# Patient Record
Sex: Male | Born: 1950
Health system: Southern US, Community
[De-identification: ages and names within clinical notes are randomized; demographics above are authoritative.]

## PROBLEM LIST (undated history)

## (undated) DIAGNOSIS — H903 Sensorineural hearing loss, bilateral: Secondary | ICD-10-CM

## (undated) DIAGNOSIS — M1990 Unspecified osteoarthritis, unspecified site: Secondary | ICD-10-CM

## (undated) DIAGNOSIS — K5790 Diverticulosis of intestine, part unspecified, without perforation or abscess without bleeding: Secondary | ICD-10-CM

## (undated) DIAGNOSIS — N200 Calculus of kidney: Secondary | ICD-10-CM

## (undated) DIAGNOSIS — I10 Essential (primary) hypertension: Secondary | ICD-10-CM

## (undated) DIAGNOSIS — N400 Enlarged prostate without lower urinary tract symptoms: Secondary | ICD-10-CM

## (undated) DIAGNOSIS — K219 Gastro-esophageal reflux disease without esophagitis: Secondary | ICD-10-CM

## (undated) HISTORY — DX: Unspecified osteoarthritis, unspecified site: M19.90

## (undated) HISTORY — DX: Sensorineural hearing loss, bilateral: H90.3

## (undated) HISTORY — DX: Calculus of kidney: N20.0

## (undated) HISTORY — PX: OTHER SURGICAL HISTORY: SHX169

## (undated) HISTORY — PX: EYE SURGERY: SHX253

## (undated) HISTORY — DX: Diverticulosis of intestine, part unspecified, without perforation or abscess without bleeding: K57.90

## (undated) HISTORY — DX: Gastro-esophageal reflux disease without esophagitis: K21.9

## (undated) HISTORY — DX: Essential (primary) hypertension: I10

## (undated) HISTORY — PX: CHOLECYSTECTOMY: SHX55

## (undated) HISTORY — DX: Benign prostatic hyperplasia without lower urinary tract symptoms: N40.0

---

## 2002-02-23 ENCOUNTER — Ambulatory Visit (HOSPITAL_COMMUNITY): Admission: RE | Admit: 2002-02-23 | Discharge: 2002-02-23 | Payer: Self-pay | Admitting: Cardiovascular Disease

## 2008-06-20 ENCOUNTER — Ambulatory Visit: Payer: Self-pay | Admitting: Cardiovascular Disease

## 2008-07-23 ENCOUNTER — Ambulatory Visit: Payer: Self-pay | Admitting: Cardiovascular Disease

## 2009-04-12 ENCOUNTER — Encounter (INDEPENDENT_AMBULATORY_CARE_PROVIDER_SITE_OTHER): Payer: Self-pay | Admitting: *Deleted

## 2009-10-28 DIAGNOSIS — K219 Gastro-esophageal reflux disease without esophagitis: Secondary | ICD-10-CM | POA: Insufficient documentation

## 2009-10-28 DIAGNOSIS — I10 Essential (primary) hypertension: Secondary | ICD-10-CM | POA: Insufficient documentation

## 2009-10-28 DIAGNOSIS — R079 Chest pain, unspecified: Secondary | ICD-10-CM | POA: Insufficient documentation

## 2009-11-14 ENCOUNTER — Ambulatory Visit: Payer: Self-pay | Admitting: Cardiovascular Disease

## 2009-11-14 DIAGNOSIS — E782 Mixed hyperlipidemia: Secondary | ICD-10-CM | POA: Insufficient documentation

## 2010-10-15 ENCOUNTER — Telehealth: Payer: Self-pay | Admitting: Cardiovascular Disease

## 2010-12-09 NOTE — Assessment & Plan Note (Signed)
Summary: rov per pt call/lg  Medications Added TEKTURNA HCT 300-12.5 MG TABS (ALISKIREN-HYDROCHLOROTHIAZIDE) 1 tab by mouth once daily PRAVASTATIN SODIUM 40 MG TABS (PRAVASTATIN SODIUM) 1 tab by mouth once daily FINASTERIDE 5 MG TABS (FINASTERIDE) 1 tab by mouth once daily ASPIRIN 81 MG TABS (ASPIRIN) take one tablet once daily ASPIRIN 81 MG TBEC (ASPIRIN) Take one tablet by mouth daily MAGNESIUM 300 MG CAPS (MAGNESIUM) 1 tab po at bedtime      Allergies Added: NKDA  CC:  check up.  History of Present Illness: Tj is seen today for f/U of HTN and elevated lipids.  He has been compliant with his meds.  He complains of the cost of Danie Chandler but it has worked well for him.  He has tried to watch his salt and alcohol intake.  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  He is divorced and not dating.  He denies SSCP, palpitatatons, dyspnea or edema.    Current Medications (verified): 1)  Tekturna Hct 300-12.5 Mg Tabs (Aliskiren-Hydrochlorothiazide) .Marland Kitchen.. 1 Tab By Mouth Once Daily 2)  Pravastatin Sodium 40 Mg Tabs (Pravastatin Sodium) .Marland Kitchen.. 1 Tab By Mouth Once Daily 3)  Finasteride 5 Mg Tabs (Finasteride) .Marland Kitchen.. 1 Tab By Mouth Once Daily 4)  Aspirin 81 Mg Tabs (Aspirin) .... Take One Tablet Once Daily 5)  Aspirin 81 Mg Tbec (Aspirin) .... Take One Tablet By Mouth Daily 6)  Magnesium 300 Mg Caps (Magnesium) .Marland Kitchen.. 1 Tab Po At Bedtime  Allergies (verified): No Known Drug Allergies  Past History:  Past Medical History: Last updated: 10/28/2009 Current Problems:  CHEST PAIN (ICD-786.50) aytpical normal cath in 2003 HYPERTENSION (ICD-401.9) GERD (ICD-530.81)  History of prostatism Smokeless Tobacco Use  Family History: Last updated: 10/28/2009  positive for premature coronary artery disease and   vascular disease in his father.   Social History: Last updated: 10/28/2009  He is divorced for 2 years.  He owns and operates a bus company.  He is   fairly sedentary.  He enjoys going to the beach.  He is sexually active   and as indicated, the Diovan HCTZ seems to be causing a problem.  He   drinks occasional alcohol.      Review of Systems       Denies fever, malais, weight loss, blurry vision, decreased visual acuity, cough, sputum, SOB, hemoptysis, pleuritic pain, palpitaitons, heartburn, abdominal pain, melena, lower extremity edema, claudication, or rash.   Vital Signs:  Patient profile:   60 year old male Height:      67 inches Weight:      220 pounds BMI:     34.58 Pulse rate:   73 / minute Pulse rhythm:   regular Resp:     12 per minute BP sitting:   146 / 93  (left arm) Cuff size:   large  Vitals Entered By: Kem Parkinson (November 14, 2009 4:26 PM)  Physical Exam  General:  Affect appropriate Healthy:  appears stated age HEENT: normal Neck supple with no adenopathy JVP normal no bruits no thyromegaly Lungs clear with no wheezing and good diaphragmatic motion Heart:  S1/S2 no murmur,rub, gallop or click PMI normal Abdomen: benighn, BS positve, no tenderness, no AAA no bruit.  No HSM or HJR Distal pulses intact with no bruits No edema Neuro non-focal Skin warm and dry    Impression & Recommendations:  Problem # 1:  HYPERTENSION (ICD-401.9) Well controlled samples of Tekturnal given The following medications were  removed from the medication list:    Diovan Hct 320-12.5 Mg Tabs (Valsartan-hydrochlorothiazide) .Marland Kitchen... 1 tab by mouth once daily His updated medication list for this problem includes:    Tekturna Hct 300-12.5 Mg Tabs (Aliskiren-hydrochlorothiazide) .Marland Kitchen... 1 tab by mouth once daily    Aspirin 81 Mg Tabs (Aspirin) .Marland Kitchen... Take one tablet once daily    Aspirin 81 Mg Tbec (Aspirin) .Marland Kitchen... Take one tablet by mouth daily  Problem # 2:  MIXED HYPERLIPIDEMIA (ICD-272.2) Lipid and liver per primary no myalgias His updated medication list for this problem includes:    Pravastatin Sodium 40 Mg Tabs  (Pravastatin sodium) .Marland Kitchen... 1 tab by mouth once daily  Patient Instructions: 1)  Your physician recommends that you schedule a follow-up appointment in:ONE YEAR    EKG Report  Procedure date:  11/14/2009  Findings:      NSR 79 Normal ECG

## 2010-12-09 NOTE — Progress Notes (Signed)
Summary: rx refill     Phone Note Refill Request Message from:  Patient on October 15, 2010 9:10 AM  DIOVAN HCT 320-12.5 MG TABS please call rx in to [id number]- [phone]   Method Requested: Telephone to Pharmacy Initial call taken by: Roe Coombs,  October 15, 2010 9:10 AM Caller: Patient  Follow-up for Phone Call        pt states he takes diovan but what we have down on our med list is Danie Chandler he states he takes his mediation occasionally and he got samples last vist she states hes not sure of what other meds he takes  will foward this to DMathis for some help Follow-up by: Kem Parkinson,  October 15, 2010 2:39 PM  Additional Follow-up for Phone Call Additional follow up Details #1::        spoke with pt, he is going to call me back tomorrow with the bottle in hand [name], RN  October 15, 2010 5:10 PM  spoke with pt, at last office visit he was given samples of tekturna and has been taking that. according to dr Eden Emms last office note the diovan has caused some problems. script sent to pharm. [name], RN  October 16, 2010 10:59 AM     Prescriptions: TEKTURNA HCT 300-12.5 MG TABS (ALISKIREN-HYDROCHLOROTHIAZIDE) 1 tab by mouth once daily  #30 x 12   Entered by:   [name], RN   Authorized by:   Colon Branch, MD, [facility]   Signed by:   [name], RN on 10/16/2010   Method used:   Electronically to        [facility]* (retail)       [address]       [place], [place]  [address]       Ph: [phone]       Fax: [phone]   RxID:   [id number]

## 2010-12-30 ENCOUNTER — Telehealth: Payer: Self-pay | Admitting: Cardiovascular Disease

## 2011-01-05 ENCOUNTER — Telehealth: Payer: Self-pay | Admitting: Cardiovascular Disease

## 2011-01-06 NOTE — Progress Notes (Signed)
Summary: talk to nurse   Phone Note Call from Patient Call back at Home Phone [phone]   Caller: Patient Reason for Call: Talk to Nurse Summary of Call: pt was given new BP meds and his BP is still running high. Wants to know what to do. Initial call taken by: Edman Circle,  December 30, 2010 10:32 AM  Follow-up for Phone Call        spoke with pt, he went to the dentist at [place] today and his bp was elevated at 130's/94. he is buying a bp check today and will track his bp for the next week and report his findings [name], RN  December 30, 2010 11:17 AM

## 2011-01-12 ENCOUNTER — Telehealth: Payer: Self-pay | Admitting: Cardiovascular Disease

## 2011-01-15 NOTE — Progress Notes (Signed)
Summary: calling regarding B/P  Medications Added LOSARTAN POTASSIUM-HCTZ 100-25 MG TABS (LOSARTAN POTASSIUM-HCTZ) one tablet by mouth once daily       Phone Note Call from Patient Call back at [facility] Phone [phone]   Caller: Patient Summary of Call: Calling regarding B/P Initial call taken by: Judie Grieve,  January 05, 2011 8:42 AM  Follow-up for Phone Call        spoke with pt,he reports bp running from 185/101 to 145/77. he would like to switch to generic bp med if possible. he took a benicar he had yesterday around 3pm and his bp later was 138/80. will foward for dr Eden Emms review [name], RN  January 05, 2011 10:07 AM   Additional Follow-up for Phone Call Additional follow up Details #1::        can change to generic hyzaar 100/25 Additional Follow-up by: Colon Branch, MD, [facility],  January 05, 2011 10:17 AM    Additional Follow-up for Phone Call Additional follow up Details #2::    pt aware of med change. he will cont to track his bp and let me know how he is doing [name], RN  January 05, 2011 10:52 AM   New/Updated Medications: LOSARTAN POTASSIUM-HCTZ 100-25 MG TABS (LOSARTAN POTASSIUM-HCTZ) one tablet by mouth once daily Prescriptions: LOSARTAN POTASSIUM-HCTZ 100-25 MG TABS (LOSARTAN POTASSIUM-HCTZ) one tablet by mouth once daily  #30 x 12   Entered by:   [name], RN   Authorized by:   Colon Branch, MD, [facility]   Signed by:   [name], RN on 01/05/2011   Method used:   Electronically to        [facility] (retail)       [address]       [place], [place]  [address]       Ph: [phone]       Fax: [phone]   RxID:   [id number]

## 2011-01-20 NOTE — Progress Notes (Signed)
Summary: blood pressure reading   Phone Note Call from Patient Call back at Home Phone [phone]   Caller: Patient Reason for Call: Talk to Nurse Summary of Call: ptcalling in blood pressure reading  feb 27th @8am   142/87 pulse 71  @ 6pm    153/86  pulse 69 @ 11 pm 161/84 pulse 71  feb 28th @ 7am 148/93 pulse 72 @ 6pm 121/80 pulse 87 feb 29th @7am  147/90 pulse 79 @ 9pm 135/77 pulse 99 march 1 @6am  127/86 pulse 83 @ 8pm 146/76 pulse 90 march 2nd @ 7am 149/82 pulse 70 @ 7pm 145/93 pulse 73   march 3rd @ 3pm 148/80 pulse 58 @7pm  148/80 pulse 71 march 4th @ 8am 152/86 pulse 61 @ 2pm 141/78 pulse 63 @ 5pm 153/79 pulse 73 @ 10pm 144/81 pulse 85 march 5th @ 8am 134/93 pulse 69 Initial call taken by: Roe Coombs,  January 12, 2011 9:04 AM  Follow-up for Phone Call        spoke with pt, he is having no problems with the hyzaar. bp readings fowarded for dr Eden Emms review [name], RN  January 12, 2011 10:27 AM   Additional Follow-up for Phone Call Additional follow up Details #1::        continue hyzaar Additional Follow-up by: Colon Branch, MD, [facility],  January 13, 2011 11:44 AM    Additional Follow-up for Phone Call Additional follow up Details #2::    pt aware [name], RN  January 14, 2011 6:02 PM

## 2011-02-03 ENCOUNTER — Telehealth: Payer: Self-pay | Admitting: Cardiovascular Disease

## 2011-02-03 DIAGNOSIS — I10 Essential (primary) hypertension: Secondary | ICD-10-CM

## 2011-02-03 NOTE — Telephone Encounter (Signed)
Stop losartan and try norvasc 10 mg

## 2011-02-03 NOTE — Telephone Encounter (Addendum)
Spoke with pt, he is having trouble with erections since starting the losartan. Will forward for dr Eden Emms review.

## 2011-02-04 MED ORDER — AMLODIPINE BESYLATE 10 MG PO TABS: 10.0000 mg | ORAL_TABLET | Freq: Every day | ORAL | Status: DC

## 2011-02-04 NOTE — Telephone Encounter (Signed)
Spoke with pt, he is aware of med change. He will call with cont problems or any new issues with the new med.

## 2011-02-10 ENCOUNTER — Telehealth: Payer: Self-pay | Admitting: Cardiology

## 2011-02-10 NOTE — Telephone Encounter (Signed)
Pt has one medication questions

## 2011-02-10 NOTE — Telephone Encounter (Signed)
Pt called today to talk with [name] about bp log and medication. Pt said he would talk with her tomorrow. Mylo Red RN

## 2011-02-11 MED ORDER — HYDROCHLOROTHIAZIDE 12.5 MG PO CAPS: 12.5000 mg | ORAL_CAPSULE | Freq: Every day | ORAL | Status: DC

## 2011-02-11 NOTE — Telephone Encounter (Signed)
Start HCTZ 12.5 mg and see how his BP is

## 2011-02-11 NOTE — Telephone Encounter (Signed)
Addended by: [name] on: 02/11/2011 03:42 PM   Modules accepted: Orders

## 2011-02-11 NOTE — Telephone Encounter (Signed)
Spoke with pt, he reports since changing to amlodipine his bp has been 167-133 over 97-79. He reports his erection problem has gotten better but is not completely resolved. Will forward for dr Eden Emms review.

## 2011-02-11 NOTE — Telephone Encounter (Signed)
Spoke with pt, he is aware of new med. He will cont to watch his bp. He was given the okay to use viagra, but was cautioned about taking it with the amlodipine. Pt voiced understanding.

## 2011-02-25 ENCOUNTER — Telehealth: Payer: Self-pay | Admitting: Cardiovascular Disease

## 2011-02-25 NOTE — Telephone Encounter (Signed)
Pt would like to talk to Barry Mcdonald re blood pressure. Pt also wants to know if Barry Mcdonald got his fax with his blood pressure reading

## 2011-02-26 NOTE — Telephone Encounter (Signed)
Spoke with pt, dr Eden Emms has reviewed pt bp readings. After talking to pt, he is only taking HCTZ, he does not think he is taking amlodipine. He is going to make sure he is taking both and if not will let us know if he needs a refill [name]

## 2011-03-05 ENCOUNTER — Telehealth: Payer: Self-pay | Admitting: Cardiology

## 2011-03-05 NOTE — Telephone Encounter (Signed)
Mr. Barry Mcdonald is calling to check on status of paperwork from [facility] that he & the school faxed last week for Dr. Eden Emms.  I could not find the paperwork.  He said he would refax it tomorrow if [name] does not have it.  He has appt next week at [facility]. Mylo Red RN

## 2011-03-06 NOTE — Telephone Encounter (Signed)
Pt made aware paperwork has been faxed Barry Mcdonald

## 2011-03-17 ENCOUNTER — Telehealth: Payer: Self-pay | Admitting: Cardiovascular Disease

## 2011-03-17 NOTE — Telephone Encounter (Signed)
Pt called regarding form from dental school in [place].  The patient has faxed as well as [employer] with no response.  Please call pt regarding this form.

## 2011-03-18 NOTE — Telephone Encounter (Signed)
Spoke with pt, the form has already been faxed but they state they did not get it. The pt is going to refax the form to me tomorrow so i can refax Barry Mcdonald

## 2011-03-24 NOTE — Assessment & Plan Note (Signed)
[facility]                            CARDIOLOGY OFFICE NOTE   NAME:HILLIARDTyus, Kelsch                   MRN:          [id number]  DATE:06/20/2008                            DOB:          Nov 15, 1950    A 60 year old patient referred by Dr. Harrison Mons for hypertension.   The patient has previously been seen in our clinic 5 years ago.   At that time, he had some atypical chest pain.   He had a heart catheterization in 2003, which was normal with an EF of  60%.  He had high blood pressure at that time.  The patient had  previously been on Altace, apparently this was switched due to do a dry  cough.  However, he did not tolerate a second medication, which he is  unsure of the name.  He is currently on Diovan HCT.  He complains of  penile shrinkage and poor ejaculation on this medicine.  I told him this  was not an obvious side effect of the medicine.  He is no longer having  a cough, however, he is fairly insisting about changing his blood  pressure pills.   In talking to him, he gets occasional exertional dyspnea.  I think this  is more deconditioning.  There is no cough, pleuritic pain, sputum  production, fever, no PND, or orthopnea.   He does have a little bit of lower extremity edema from time-to-time  when standing.   His coronary risk factors otherwise include smokeless tobacco.   I did spend less than 10 minutes in counseling about the risks of oral  cancer regarding his smokeless tobacco use.  He does about 2 times a  week.   I do not have recent cholesterol on him.  He is a nonsmoker.  Outside of  the smokeless tobacco.  He has no known allergies.  His past medical  history is otherwise fairly benign.  He denies any allergies.  He does  have some prostatism and some reflux.   His medication include,  1. Finasteride 5 mg a day.  2. Nexium 40 a day.  3. Diovan 320/25.   Family history is positive for premature coronary artery disease  and  vascular disease in his father.   He is divorced for 2 years.  He owns and operates a bus company.  He is  fairly sedentary.  He enjoys going to the beach.  He is sexually active  and as indicated, the Diovan HCTZ seems to be causing a problem.  He  drinks occasional alcohol.   PHYSICAL EXAMINATION:  GENERAL:  Exam is remarkable for a healthy-  appearing middle-aged white male in no distress.  VITAL SIGNS:  Weight is 222, blood pressure is 127/86, pulse 82 and  regular, respiratory rate is 14, afebrile.  HEENT:  Unremarkable.  Carotids are normal without bruit.  No  lymphadenopathy, thyromegaly, or JVP elevation.  LUNGS:  Clear to diaphragmatic motion.  No wheezing.  HEART:  S1-S2, normal heart sounds.  PMI normal.  ABDOMEN:  Benign.  Bowel sounds positive.  No AAA, no tenderness, no  bruit, no  hepatosplenomegaly, or hepatojugular reflux.  EXTREMITIES:  Distal pulses were intact.  No edema.  NEURO:  Nonfocal.  SKIN:  Warm and dry.  No muscular weakness.   EKG is normal without LVH.   IMPRESSION:  1. Hypertension.  We will try to give him Tekturna HCTZ in light of      his problems on Diovan.  This will hopefully not cause a cough or      any other side effects.  His health plan is such that he has a      $5000 co-pay for the whole year and I do not think the Danie Chandler      will be excessive.  I did give him samples to lasting 4-5 weeks      until I can reevaluate his blood pressure.  Continue low-salt diet      and weight loss.  2. Smokeless tobacco.  Encouraged the patient to quit, told him he      could get over-the-counter Nicorette patches or lozenges.  He is      only doing 2 times a week or/so he says this should be a reasonable      approach.  He has been going to the dentist in [place] on a      regular basis.  3. Health maintenance.  Followup with Dr. Harrison Mons for general health      maintenance.  In particular, he needs followup PSA given his      prostatism and  he will continue his finasteride at 5 mg a day.  4. Reflux.  Encouraged weight loss, decreased caloric intake      particularly late in the day.  Avoid recumbency 1 hour after meals.      Continue Nexium 40 a day.     Noralyn Pick. Eden Emms, MD, [facility]  Electronically Signed    PCN/MedQ  DD: 06/20/2008  DT: 06/20/2008  Job #: [id number]

## 2011-03-24 NOTE — Assessment & Plan Note (Signed)
[facility] HE[facility]                            CARDIOLOGY OFFICE NOTE   NAME:[name], Barry                   MRN:          [id number]  DATE:07/23/2008                            DOB:          [birth year]    [name] returns today for followup.  He had atypical chest pain in the  past with a normal cath.   He has significant hypertension.  He has had pretty good results with  the Tekturna 320/12.5.  Blood pressure seems to be under reasonable  control.   He needs to lose about 10 or 15 pounds.  He continues to have some  excess salt in his diet.   He is ambulating without difficulty.  He has had resolution of his lower  extremity edema.  He does not have any significant chest pain.  Unfortunately, he continues to use smokeless tobacco.   PHYSICAL EXAMINATION:  VITAL SIGNS:  Blood pressure today is 130/80,  pulse 70 and regular, respiratory 14, afebrile.  Weight is 221.  HEENT:  Unremarkable.  NECK:  Carotids are normal without bruit.  No lymphadenopathy,  thyromegaly, or JVP elevation.  LUNGS:  Clear with good diaphragmatic motion.  No wheezing.  S1 and S2  with normal heart sounds.  PMI normal.  ABDOMEN:  Benign.  Bowel sounds positive.  No AAA, no tenderness, no  bruit, no hepatosplenomegaly, hepatojugular reflux, no tenderness.  EXTREMITIES:  Distal pulses are intact.  No edema.  NEURO:  Nonfocal.  SKIN:  Warm and dry.  No muscle weakness.   IMPRESSION:  1. Hypertension, improved control on Tekturna.  Continue this and low-      salt diet.  2. Previous atypical chest pain with normal cath.  Continue to follow.      No indication for stress test at this time.  3. History of reflux.  Continue Nexium 40 mg a day.  Avoid spicy food      and late-night meals.  4. History of prostatism.  Continue finasteride 5 mg a day.  Followup      prostate-specific antigen in 6 months.     Noralyn Pick. Eden Emms, MD, [facility]  Electronically Signed    PCN/MedQ  DD:  07/23/2008  DT: 07/24/2008  Job #: [id number]

## 2011-03-27 NOTE — Cardiovascular Report (Signed)
[place]  Patient:    Barry Mcdonald, Barry Mcdonald Visit Number: [id number] MRN: [id number]          Service Type: CAT Location: [facility] 2899 10 Attending Physician:  Colon Branch Dictated by:   Noralyn Pick Eden Emms, M.D. [facility] Admit Date:  02/23/2002 Discharge Date: 02/23/2002   CC:         Everlene Other, M.D.   Cardiac Catheterization  PROCEDURE:  Coronary arteriography.  INDICATIONS:  Chest pain, Cardiolite study suggesting possibility of inferior wall ischemia.  PROCEDURAL NOTE:  Standard catheterization was done from the right femoral artery.  Judkins left and right #4 catheters were used.  FINDINGS: 1. The left main coronary artery was normal.  2. The left anterior descending artery was normal.  3. The circumflex coronary artery was normal.  4. There was some catheter-tip spasm on engagement of the right coronary    artery; however, the right coronary artery was dominant and normal.  RAO VENTRICULOGRAPHY:  RAO ventriculography was normal.  Ejection fraction 65%.  There was no gradient across the aortic valve and no MR.  LV pressure was 115/15.  Aortic pressure was 122/75.  IMPRESSION:  Patients primary problem at this point would appear to be hypertension.  We will treat this aggressively with ACE inhibitors.  He will be discharged later today.  He will follow up with Dr. Everlene Other and myself in regards to his general medical care. Dictated by:   Noralyn Pick Eden Emms, M.D. [facility] Attending Physician:  Colon Branch DD:  02/23/02 TD:  02/23/02 Job: [id number]

## 2011-04-10 ENCOUNTER — Telehealth: Payer: Self-pay | Admitting: Cardiovascular Disease

## 2011-04-10 NOTE — Telephone Encounter (Signed)
[employer] [facility] [employer] calling for clearance for Barry Mcdonald to have a procedure done--dr nishan did sign off on this but they wanted more info and new signature--provided more information and had dr Eden Emms resign document and faxed to [facility] again and called to be sure they received document which they did--nt

## 2011-04-10 NOTE — Telephone Encounter (Signed)
Pt has fax papers 3 times for sur clearance for dental work. Pt dentist has fax paper four times for sur clearance for dental work. Pt would like to talk to a nurse.

## 2011-05-25 ENCOUNTER — Telehealth: Payer: Self-pay | Admitting: Cardiovascular Disease

## 2011-05-25 NOTE — Telephone Encounter (Signed)
Pt has a question regarding his medication

## 2011-05-25 NOTE — Telephone Encounter (Signed)
Spoke with pt, questions answered Barry Mcdonald  

## 2011-06-10 ENCOUNTER — Telehealth: Payer: Self-pay | Admitting: Cardiovascular Disease

## 2011-06-10 NOTE — Telephone Encounter (Signed)
Spoke with pt, he wanted me to call his dtr because she has been c/o chest pain and he wants for her to be seen. She lives in [place] and will call and touch base with her [name]

## 2011-06-10 NOTE — Telephone Encounter (Signed)
Wants to give more input regarding mediation.

## 2011-08-04 ENCOUNTER — Telehealth: Payer: Self-pay | Admitting: Cardiovascular Disease

## 2011-08-04 NOTE — Telephone Encounter (Signed)
Pt calling asking that [name] call pt back.

## 2011-08-04 NOTE — Telephone Encounter (Signed)
Spoke with pt, questions answered Barry Mcdonald  

## 2011-10-06 ENCOUNTER — Telehealth: Payer: Self-pay | Admitting: Cardiovascular Disease

## 2011-10-06 NOTE — Telephone Encounter (Signed)
Spoke with pt ,pt is needing last  5 years of records mailed to his home address  Records reviewed and  mailed  3 office notes and  ekg from 1-11 at pt's request ./cy

## 2011-10-06 NOTE — Telephone Encounter (Signed)
New message: Please call him regarding a note that he needs.  Please call him back.

## 2012-02-09 ENCOUNTER — Other Ambulatory Visit: Payer: Self-pay | Admitting: Cardiovascular Disease

## 2012-02-09 DIAGNOSIS — I10 Essential (primary) hypertension: Secondary | ICD-10-CM

## 2012-02-09 MED ORDER — AMLODIPINE BESYLATE 10 MG PO TABS: 10.0000 mg | ORAL_TABLET | Freq: Every day | ORAL | Status: DC

## 2012-02-09 NOTE — Telephone Encounter (Signed)
Wanted to verify if pt is suppose to be taking HCTZ and Hyzaar spoke with.Pt states he is...per last note it states (he is having no problems with the hyzaar. bp readings fowarded for dr Eden Emms review [name], RN  January 12, 2011 10:27 AM continue hyzaar) This documentation is in EMR. Pt states he wants a call back regarding what meds he needs to be on

## 2012-02-09 NOTE — Telephone Encounter (Signed)
PT OVER DUE FOR APPT. appointment MADE  FOR  03-09-12 AT 10:00 AM .Zack Seal

## 2012-02-09 NOTE — Telephone Encounter (Signed)
Spoke with pt needed  Amlodipine  10 mg filled not hctz  appropriate  med refilled  via epic STRESSED TO PT THE NEED  TO KEEP APPT WITH DR Eden Emms VERBALIZED UNDERSTANDING./CY

## 2012-02-26 ENCOUNTER — Encounter: Payer: Self-pay | Admitting: *Deleted

## 2012-03-09 ENCOUNTER — Encounter: Payer: Self-pay | Admitting: Cardiovascular Disease

## 2012-03-09 ENCOUNTER — Telehealth: Payer: Self-pay | Admitting: *Deleted

## 2012-03-09 ENCOUNTER — Ambulatory Visit (INDEPENDENT_AMBULATORY_CARE_PROVIDER_SITE_OTHER): Payer: BC Managed Care – PPO | Admitting: Cardiovascular Disease

## 2012-03-09 VITALS — BP 142/82 | HR 60 | Wt 216.0 lb

## 2012-03-09 DIAGNOSIS — R079 Chest pain, unspecified: Secondary | ICD-10-CM

## 2012-03-09 DIAGNOSIS — I10 Essential (primary) hypertension: Secondary | ICD-10-CM

## 2012-03-09 DIAGNOSIS — K439 Ventral hernia without obstruction or gangrene: Secondary | ICD-10-CM

## 2012-03-09 DIAGNOSIS — E782 Mixed hyperlipidemia: Secondary | ICD-10-CM

## 2012-03-09 DIAGNOSIS — Z79899 Other long term (current) drug therapy: Secondary | ICD-10-CM

## 2012-03-09 MED ORDER — LOSARTAN POTASSIUM-HCTZ 100-12.5 MG PO TABS: 1.0000 | ORAL_TABLET | Freq: Every day | ORAL | Status: DC

## 2012-03-09 NOTE — Progress Notes (Signed)
Patient ID: Barry Mcdonald, male   DOB: [birth year], 61 y.o.   MRN: [id number] Patient has not been seen since 9/9.  He had atypical chest pain in the past with a normal cath. He has significant hypertension. He has had pretty good results with the Tekturna 320/12.5. Now just on amlodipine Blood pressure seems to be under reasonable control. He needs to lose about 10 or 15 pounds. He continues to have some excess salt in his diet. He is ambulating without difficulty. He has had resolution of his lower extremity edema. He does not have any significant chest pain. He quit using  smokeless tobacco two years ago..  Cath in 2003 reviewed and no significant CAD  ROS: Denies fever, malais, weight loss, blurry vision, decreased visual acuity, cough, sputum, SOB, hemoptysis, pleuritic pain, palpitaitons, heartburn, abdominal pain, melena, lower extremity edema, claudication, or rash.  All other systems reviewed and negative   General: Affect appropriate Healthy:  appears stated age HEENT: normal Neck supple with no adenopathy JVP normal no bruits no thyromegaly Lungs clear with no wheezing and good diaphragmatic motion Heart:  S1/S2 no murmur,rub, gallop or click PMI normal Abdomen: benighn, BS positve, no tenderness, no AAA ventral hernia no bruit.  No HSM or HJR Distal pulses intact with no bruits No edema Neuro non-focal Skin warm and dry No muscular weakness  Medications Current Outpatient Prescriptions  Medication Sig Dispense Refill  . amLODipine (NORVASC) 10 MG tablet Take 1 tablet (10 mg total) by mouth daily.  30 tablet  1  . hydrochlorothiazide (,MICROZIDE/HYDRODIURIL,) 12.5 MG capsule Take 1 capsule (12.5 mg total) by mouth daily.  30 capsule  11    Allergies Review of patient's allergies indicates no known allergies.  Family History: Family History  Problem Relation Age of Onset  . Coronary artery disease Father     Social History: History   Social History  . Marital  Status: Divorced    Spouse Name: N/A    Number of Children: N/A  . Years of Education: N/A   Occupational History  . owns business     bus company   Social History Main Topics  . Smoking status: Former Games developer  . Smokeless tobacco: Not on file  . Alcohol Use: Yes     ocassionally  . Drug Use: Not on file  . Sexually Active: Not on file   Other Topics Concern  . Not on file   Social History Narrative  . No narrative on file    Electrocardiogram:  NSR rate 60 normal ECG  Assessment and Plan

## 2012-03-09 NOTE — Telephone Encounter (Signed)
SPOKE WITH PT  IS NOT TAKING AMLODIPINE IS TAKING HYDROCHLOROTHIAZIDE  12.5 MG  EVERY DAY

## 2012-03-09 NOTE — Telephone Encounter (Signed)
PT AWARE TO STOP HYDROCHLOROTHIAZIDE AND TO START  HYZAAR 100/12.5 MG EVERY DAY AND HAS F/U  MID June WITH BMET SAME DAY

## 2012-03-09 NOTE — Patient Instructions (Signed)
Your physician wants you to follow-up in:   YEAR WITH  DR NISHAN  You will receive a reminder letter in the mail two months in advance. If you don't receive a letter, please call our office to schedule the follow-up appointment.  

## 2012-03-09 NOTE — Assessment & Plan Note (Signed)
Discussed diet Needs primary to get lab work  He will see one for liscence physical

## 2012-03-09 NOTE — Assessment & Plan Note (Signed)
Obesity and sedentary.  No pain discussed weight loss No surgical indications

## 2012-03-09 NOTE — Assessment & Plan Note (Signed)
He will call us with med he is taking and we will call in another to better control his BP

## 2012-03-09 NOTE — Assessment & Plan Note (Signed)
Resolved normal cath in 2003 and normal ECG today

## 2012-04-20 ENCOUNTER — Encounter: Payer: Self-pay | Admitting: Cardiovascular Disease

## 2012-04-20 ENCOUNTER — Ambulatory Visit (INDEPENDENT_AMBULATORY_CARE_PROVIDER_SITE_OTHER): Payer: BC Managed Care – PPO | Admitting: Cardiovascular Disease

## 2012-04-20 VITALS — BP 130/80 | HR 62 | Wt 210.0 lb

## 2012-04-20 DIAGNOSIS — I10 Essential (primary) hypertension: Secondary | ICD-10-CM

## 2012-04-20 DIAGNOSIS — R350 Frequency of micturition: Secondary | ICD-10-CM

## 2012-04-20 DIAGNOSIS — Z79899 Other long term (current) drug therapy: Secondary | ICD-10-CM

## 2012-04-20 DIAGNOSIS — N400 Enlarged prostate without lower urinary tract symptoms: Secondary | ICD-10-CM | POA: Insufficient documentation

## 2012-04-20 DIAGNOSIS — E782 Mixed hyperlipidemia: Secondary | ICD-10-CM

## 2012-04-20 LAB — BASIC METABOLIC PANEL
Calcium: 9.2 mg/dL (ref 8.4–10.5)
Creatinine, Ser: 0.9 mg/dL (ref 0.4–1.5)
GFR: 92.81 mL/min (ref >60.00)
Glucose, Bld: 98 mg/dL (ref 70–99)
Sodium: 140 mEq/L (ref 135–145)

## 2012-04-20 NOTE — Assessment & Plan Note (Signed)
Well controlled.  Continue current medications and low sodium Dash type diet.    

## 2012-04-20 NOTE — Patient Instructions (Addendum)
You have been referred to Dr. Isabel Caprice at [facility] urology  Your physician recommends that you schedule a follow-up appointment in: 3 months with Dr. Eden Emms

## 2012-04-20 NOTE — Assessment & Plan Note (Signed)
It would be nice to use alpha blocker to aid with BP control.  F/U with Dr Isabel Caprice.  May opt for Proscar or rapidflo

## 2012-04-20 NOTE — Progress Notes (Signed)
Patient ID: Barry Mcdonald, male   DOB: 02-Sep-1951, 61 y.o.   MRN: [id number] Patient has not been seen since 9/9. He had atypical chest pain in the past with a normal cath. He has significant hypertension. He has had pretty good results with the Tekturna 320/12.5. Was on amlodipine that was ineffective.  Now on Hyzaar.  Blood pressure seems to be under reasonable control. He needs to lose about 10 or 15 pounds. He continues to have some excess salt in his diet. He is ambulating without difficulty. He has had resolution of his lower extremity edema. He does not have any significant chest pain. He quit using smokeless tobacco two years ago..  Cath in 2003 reviewed and no significant CAD  Needs to see urology.  Has urgency and frequency.  Was on Flomax in past but did not like side effect of retrograde ejaculation.  ROS: Denies fever, malais, weight loss, blurry vision, decreased visual acuity, cough, sputum, SOB, hemoptysis, pleuritic pain, palpitaitons, heartburn, abdominal pain, melena, lower extremity edema, claudication, or rash.  All other systems reviewed and negative  General: Affect appropriate Healthy:  appears stated age HEENT: normal Neck supple with no adenopathy JVP normal no bruits no thyromegaly Lungs clear with no wheezing and good diaphragmatic motion Heart:  S1/S2 no murmur, no rub, gallop or click PMI normal Abdomen: benighn, BS positve, no tenderness, no AAA no bruit.  No HSM or HJR Distal pulses intact with no bruits No edema Neuro non-focal Skin warm and dry No muscular weakness   Current Outpatient Prescriptions  Medication Sig Dispense Refill  . losartan-hydrochlorothiazide (HYZAAR) 100-12.5 MG per tablet Take 1 tablet by mouth daily.  30 tablet  11  . Naproxen Sodium (ALEVE PO) Take 2 tablets by mouth daily.      Marland Kitchen omeprazole (PRILOSEC) 20 MG capsule Take 20 mg by mouth daily.        Allergies  Review of patient's allergies indicates no known  allergies.  Electrocardiogram:  Assessment and Plan

## 2012-04-20 NOTE — Assessment & Plan Note (Signed)
Cholesterol is at goal.  Continue current dose of statin and diet Rx.  No myalgias or side effects.  F/U  LFT's in 6 months. No results found for this basename: LDLCALC             

## 2012-07-19 ENCOUNTER — Ambulatory Visit: Payer: BC Managed Care – PPO | Admitting: Cardiovascular Disease

## 2012-08-10 ENCOUNTER — Telehealth: Payer: Self-pay | Admitting: Cardiovascular Disease

## 2012-08-10 NOTE — Telephone Encounter (Signed)
New problem:    Patient calling wants to know What's  the reason for the office visit.

## 2012-08-10 NOTE — Telephone Encounter (Signed)
Spoke with pt. He cancelled appt  with Dr. Eden Emms for 10/3 (also cancelled appt 9/10) and is asking why this appt was needed.  I reviewed previous note and told him this was a 3 month follow up appt to check on hypertension and how he is doing.   Pt is concerned about cost of appt ($250 out of pocket) and states he is feeling great. He states he will monitor blood pressure for a week and send readings to [place]. He states that if Dr. Eden Emms still wants to see him after reviewing readings to call him and he will come in for appt.

## 2012-08-11 ENCOUNTER — Ambulatory Visit: Payer: BC Managed Care – PPO | Admitting: Cardiovascular Disease

## 2012-08-24 NOTE — Telephone Encounter (Signed)
LMTCB ./CY 

## 2012-08-24 NOTE — Telephone Encounter (Signed)
AWAITING ON PT TO FAX B/P LOG FOR DR Eden Emms TO REVIEW./CY

## 2012-08-24 NOTE — Telephone Encounter (Signed)
F/u   Patient returning nurse call, he can be reached at [phone].

## 2012-09-14 NOTE — Telephone Encounter (Signed)
SPOKE WITH PT WILL FORWARD B/P READINGS  TO OFFICE IN THE AM./CY

## 2012-10-04 NOTE — Telephone Encounter (Signed)
LMTCB ./CY 

## 2012-10-10 NOTE — Telephone Encounter (Signed)
RECEIVED B/P READINGS FROM PT .   AFTER REVIEWED  BY DR Eden Emms  NO CHANGES WERE MADE WITH  [facility]  WITH SAME THERAPY PER DR NISHAN. PT AWARE./CY

## 2013-04-20 ENCOUNTER — Encounter: Payer: Self-pay | Admitting: Cardiovascular Disease

## 2013-04-24 ENCOUNTER — Telehealth: Payer: Self-pay | Admitting: *Deleted

## 2013-04-24 ENCOUNTER — Other Ambulatory Visit: Payer: Self-pay | Admitting: *Deleted

## 2013-04-24 MED ORDER — LOSARTAN POTASSIUM-HCTZ 100-12.5 MG PO TABS: 1.0000 | ORAL_TABLET | Freq: Every day | ORAL | Status: DC

## 2013-04-24 NOTE — Telephone Encounter (Signed)
He is supposed to be on this ok to refill

## 2013-04-24 NOTE — Telephone Encounter (Signed)
Patient's pharmacy calling to refill losartan-hctz. Did not see it in the chart and patient is due for appointment. I will forward to nurse and Dr for approval or refusal of medication.    Micki Riley, CMA

## 2013-04-28 MED ORDER — LOSARTAN POTASSIUM-HCTZ 100-12.5 MG PO TABS: 1.0000 | ORAL_TABLET | Freq: Every day | ORAL | Status: DC

## 2013-04-28 NOTE — Telephone Encounter (Signed)
REFILL SENT , NOTED PT IS OVERDUE FOR APPT  WILL NEED FOLLOW  UP FOR FUTURE REFILLS .Zack Seal

## 2013-04-28 NOTE — Addendum Note (Signed)
Addended by: Scherrie Bateman E on: 04/28/2013 10:45 AM   Modules accepted: Orders

## 2014-01-24 ENCOUNTER — Other Ambulatory Visit: Payer: Self-pay

## 2014-01-24 MED ORDER — LOSARTAN POTASSIUM-HCTZ 100-12.5 MG PO TABS: 1.0000 | ORAL_TABLET | Freq: Every day | ORAL | Status: DC

## 2014-02-21 ENCOUNTER — Ambulatory Visit: Payer: BC Managed Care – PPO | Admitting: Cardiovascular Disease

## 2014-03-15 ENCOUNTER — Ambulatory Visit (INDEPENDENT_AMBULATORY_CARE_PROVIDER_SITE_OTHER): Payer: No Typology Code available for payment source | Admitting: Cardiovascular Disease

## 2014-03-15 ENCOUNTER — Other Ambulatory Visit: Payer: Self-pay | Admitting: *Deleted

## 2014-03-15 ENCOUNTER — Encounter: Payer: Self-pay | Admitting: Cardiovascular Disease

## 2014-03-15 VITALS — BP 140/88 | HR 64 | Ht 67.0 in | Wt 222.0 lb

## 2014-03-15 DIAGNOSIS — Z0000 Encounter for general adult medical examination without abnormal findings: Secondary | ICD-10-CM

## 2014-03-15 DIAGNOSIS — K439 Ventral hernia without obstruction or gangrene: Secondary | ICD-10-CM

## 2014-03-15 DIAGNOSIS — I10 Essential (primary) hypertension: Secondary | ICD-10-CM

## 2014-03-15 MED ORDER — PRAVASTATIN SODIUM 40 MG PO TABS: 40.0000 mg | ORAL_TABLET | Freq: Every evening | ORAL | Status: DC

## 2014-03-15 MED ORDER — LOSARTAN POTASSIUM-HCTZ 100-12.5 MG PO TABS: 1.0000 | ORAL_TABLET | Freq: Every day | ORAL | Status: DC

## 2014-03-15 NOTE — Assessment & Plan Note (Signed)
Resolved Normal ECG previously normal ETT and cath  Observe

## 2014-03-15 NOTE — Assessment & Plan Note (Signed)
Personally don't think it needs to be repaired  Refer to Dr Zella Richer

## 2014-03-15 NOTE — Patient Instructions (Signed)
Your physician wants you to follow-up in:  [place] will receive a reminder letter in the mail two months in advance. If you don't receive a letter, please call our office to schedule the follow-up appointment. Your physician recommends that you continue on your current medications as directed. Please refer to the Current Medication list given to you today. Your physician recommends that you return for lab work in: FASTING BMET  CBC  LIPID LIVER  PSA You have been referred to  [facility]  Barkley Bruns

## 2014-03-15 NOTE — Addendum Note (Signed)
Addended by: Devra Dopp E on: 03/15/2014 04:52 PM   Modules accepted: Orders

## 2014-03-15 NOTE — Assessment & Plan Note (Signed)
F/U urology needs PSA

## 2014-03-15 NOTE — Assessment & Plan Note (Signed)
Well controlled.  Continue current medications and low sodium Dash type diet.   [facility]  Check labs including K / Cr

## 2014-03-15 NOTE — Progress Notes (Signed)
Patient ID: Barry Mcdonald, male   DOB: 1951-07-13, 63 y.o.   MRN: [id number] Patient has not been seen since 2013 . He had atypical chest pain in the past with a normal cath. He has significant hypertension. He has had pretty good results with the Tekturna 320/12.5. Was on amlodipine that was ineffective. Now on Hyzaar. Blood pressure seems to be under reasonable control. He needs to lose about 10 or 15 pounds. He continues to have some excess salt in his diet. He is ambulating without difficulty. He has had resolution of his lower extremity edema. He does not have any significant chest pain. He quit using smokeless tobacco two years ago..  Cath in 2003 reviewed and no significant CAD   Was on proscar for prostate but has not f/u with [facility] Bothered by ventral hernia and wants surgical referral  Needs f/u blood work      ROS: Denies fever, malais, weight loss, blurry vision, decreased visual acuity, cough, sputum, SOB, hemoptysis, pleuritic pain, palpitaitons, heartburn, abdominal pain, melena, lower extremity edema, claudication, or rash.  All other systems reviewed and negative  General: Affect appropriate Healthy:  appears stated age 65: normal Neck supple with no adenopathy JVP normal no bruits no thyromegaly Lungs clear with no wheezing and good diaphragmatic motion Heart:  S1/S2 no murmur, no rub, gallop or click PMI normal Abdomen: benighn, BS positve, no tenderness, no AAA no bruit.  No HSM or HJR Distal pulses intact with no bruits No edema Neuro non-focal Skin warm and dry No muscular weakness   Current Outpatient Prescriptions  Medication Sig Dispense Refill  . losartan-hydrochlorothiazide (HYZAAR) 100-12.5 MG per tablet Take 1 tablet by mouth daily.  90 tablet  0  . Naproxen Sodium (ALEVE PO) Take 2 tablets by mouth daily.      Marland Kitchen omeprazole (PRILOSEC) 20 MG capsule Take 20 mg by mouth daily.       No current facility-administered medications for this visit.     Allergies  Review of patient's allergies indicates no known allergies.  Electrocardiogram:  SR rate 64  Normal ECG   Assessment and Plan

## 2014-03-27 ENCOUNTER — Ambulatory Visit (INDEPENDENT_AMBULATORY_CARE_PROVIDER_SITE_OTHER): Payer: No Typology Code available for payment source | Admitting: General Surgery

## 2014-04-05 ENCOUNTER — Telehealth: Payer: Self-pay | Admitting: Cardiovascular Disease

## 2014-04-05 NOTE — Telephone Encounter (Signed)
He has no CAD so ED pill is up to him and his primary care doctor

## 2014-04-05 NOTE — Telephone Encounter (Signed)
Left message for pt of dr nishan's recommendations.

## 2014-04-05 NOTE — Telephone Encounter (Signed)
New message     Question about [facility]

## 2014-04-05 NOTE — Telephone Encounter (Signed)
Spoke with pt, he would like to know which ED medicine dr Johnsie Cancel would recommend for him. Will forward to dr Johnsie Cancel

## 2014-04-10 ENCOUNTER — Encounter (INDEPENDENT_AMBULATORY_CARE_PROVIDER_SITE_OTHER): Payer: Self-pay | Admitting: General Surgery

## 2014-04-10 ENCOUNTER — Ambulatory Visit (INDEPENDENT_AMBULATORY_CARE_PROVIDER_SITE_OTHER): Payer: No Typology Code available for payment source | Admitting: General Surgery

## 2014-04-10 VITALS — BP 126/80 | HR 67 | Temp 97.4°F | Ht 68.0 in | Wt 221.0 lb

## 2014-04-10 DIAGNOSIS — M6208 Separation of muscle (nontraumatic), other site: Secondary | ICD-10-CM | POA: Insufficient documentation

## 2014-04-10 DIAGNOSIS — K429 Umbilical hernia without obstruction or gangrene: Secondary | ICD-10-CM

## 2014-04-10 DIAGNOSIS — M6200 Separation of muscle (nontraumatic), unspecified site: Secondary | ICD-10-CM

## 2014-04-10 NOTE — Patient Instructions (Signed)
You have a condition called diastasis recti. It is not a hernia. It does not need surgical repair.

## 2014-04-10 NOTE — Progress Notes (Signed)
Patient ID: Barry Mcdonald, male   DOB: [birth year], 63 y.o.   MRN: [id number]  Chief Complaint  Patient presents with  . Umbilical Hernia    HPI Barry Mcdonald is a 63 y.o. male.   HPI  He is referred by Dr. Johnsie Cancel for further evaluation and treatment of a ventral hernia.  He has noticed an epigastric bulge when he sits up or overeats for about 3 years. He was told he may have a ventral hernia about 3 years ago. He is referred here for further evaluation and treatment. No chronic cough. No constipation. No difficulty with urination. No pain.  No previous abdominal surgery.  Past Medical History  Diagnosis Date  . Chest pain   . Hypertension   . GERD (gastroesophageal reflux disease)   . Prostatism     History reviewed. No pertinent past surgical history.  Family History  Problem Relation Age of Onset  . Coronary artery disease Father     Social History History  Substance Use Topics  . Smoking status: Former Research scientist (life sciences)  . Smokeless tobacco: Not on file  . Alcohol Use: Yes     Comment: ocassionally    No Known Allergies  Current Outpatient Prescriptions  Medication Sig Dispense Refill  . losartan-hydrochlorothiazide (HYZAAR) 100-12.5 MG per tablet Take 1 tablet by mouth daily.  90 tablet  3  . Naproxen Sodium (ALEVE PO) Take 2 tablets by mouth daily.      . pravastatin (PRAVACHOL) 40 MG tablet Take 1 tablet (40 mg total) by mouth every evening.  90 tablet  3   No current facility-administered medications for this visit.    Review of Systems Review of Systems  Constitutional: Negative for unexpected weight change.  Gastrointestinal: Positive for abdominal distention. Negative for constipation.  Genitourinary: Negative for difficulty urinating.  Musculoskeletal: Positive for arthralgias.    Blood pressure 126/80, pulse 67, temperature 97.4 F (36.3 C), height 5\' 8"  (1.727 m), weight 221 lb (100.245 kg).  Physical Exam Physical Exam  Constitutional: No  distress.  Overweight male in no acute distress.  HENT:  Head: Normocephalic and atraumatic.  Abdominal: Soft. He exhibits no mass. There is no tenderness.  A diastases recti is present. There is a very small umbilical hernia present that is reducible and nontender.  Neurological: He is alert.  Skin: Skin is warm and dry.  Psychiatric: He has a normal mood and affect. His behavior is normal.    Data Reviewed Note from Dr. Johnsie Cancel  Assessment    1. Diastases recti with no evidence of ventral hernia.  2. Small asymptomatic umbilical hernia     Plan    No surgical intervention needed. We discussed the etiology and course of diastases recti at length. He seems to understand all this.  Return prn.       Rhunette Croft Jennalyn Cawley 04/10/2014, 3:36 PM

## 2014-04-11 ENCOUNTER — Telehealth: Payer: Self-pay | Admitting: Cardiovascular Disease

## 2014-04-11 MED ORDER — ESOMEPRAZOLE MAGNESIUM 40 MG PO PACK: 40.0000 mg | PACK | Freq: Every day | ORAL | Status: DC

## 2014-04-11 NOTE — Telephone Encounter (Signed)
Patient would like a script for Nexium, please call and advise.

## 2014-04-11 NOTE — Telephone Encounter (Signed)
PT   NEEDED  REFILL ON   NEXIUM  40 MG    NOT  PRAVASTATIN  NEW   SCRIPT  SENT  VIA  EPIC  AT PT'S REQUEST .Adonis Housekeeper

## 2014-04-16 ENCOUNTER — Other Ambulatory Visit (INDEPENDENT_AMBULATORY_CARE_PROVIDER_SITE_OTHER): Payer: No Typology Code available for payment source

## 2014-04-16 DIAGNOSIS — Z0000 Encounter for general adult medical examination without abnormal findings: Secondary | ICD-10-CM

## 2014-04-16 LAB — LIPID PANEL
CHOLESTEROL: 138 mg/dL (ref 0–200)
HDL: 29.8 mg/dL — ABNORMAL LOW (ref >39.00)
LDL Cholesterol: 92 mg/dL (ref 0–99)
NONHDL: 108.2
Total CHOL/HDL Ratio: 5
Triglycerides: 80 mg/dL (ref 0.0–149.0)
VLDL: 16 mg/dL (ref 0.0–40.0)

## 2014-04-16 LAB — BASIC METABOLIC PANEL
BUN: 16 mg/dL (ref 6–23)
CALCIUM: 9.4 mg/dL (ref 8.4–10.5)
CO2: 30 mEq/L (ref 19–32)
CREATININE: 0.9 mg/dL (ref 0.4–1.5)
Chloride: 105 mEq/L (ref 96–112)
GFR: 87.63 mL/min (ref >60.00)
Glucose, Bld: 95 mg/dL (ref 70–99)
POTASSIUM: 4.1 meq/L (ref 3.5–5.1)
Sodium: 139 mEq/L (ref 135–145)

## 2014-04-16 LAB — HEPATIC FUNCTION PANEL
ALT: 18 U/L (ref 0–53)
AST: 20 U/L (ref 0–37)
Albumin: 3.8 g/dL (ref 3.5–5.2)
Alkaline Phosphatase: 62 U/L (ref 39–117)
BILIRUBIN DIRECT: 0.2 mg/dL (ref 0.0–0.3)
BILIRUBIN TOTAL: 1.1 mg/dL (ref 0.2–1.2)
Total Protein: 5.7 g/dL — ABNORMAL LOW (ref 6.0–8.3)

## 2014-04-16 LAB — CBC WITH DIFFERENTIAL/PLATELET
Basophils Absolute: 0.1 10*3/uL (ref 0.0–0.1)
Basophils Relative: 0.8 % (ref 0.0–3.0)
EOS PCT: 2.6 % (ref 0.0–5.0)
Eosinophils Absolute: 0.2 10*3/uL (ref 0.0–0.7)
HCT: 44.8 % (ref 39.0–52.0)
Hemoglobin: 15.4 g/dL (ref 13.0–17.0)
LYMPHS PCT: 27.7 % (ref 12.0–46.0)
Lymphs Abs: 2.3 10*3/uL (ref 0.7–4.0)
MCHC: 34.2 g/dL (ref 30.0–36.0)
MCV: 97.1 fl (ref 78.0–100.0)
MONOS PCT: 7.3 % (ref 3.0–12.0)
Monocytes Absolute: 0.6 10*3/uL (ref 0.1–1.0)
Neutro Abs: 5.1 10*3/uL (ref 1.4–7.7)
Neutrophils Relative %: 61.6 % (ref 43.0–77.0)
PLATELETS: 259 10*3/uL (ref 150.0–400.0)
RBC: 4.62 Mil/uL (ref 4.22–5.81)
RDW: 13.1 % (ref 11.5–15.5)
WBC: 8.2 10*3/uL (ref 4.0–10.5)

## 2014-04-16 LAB — PSA: PSA: 0.37 ng/mL (ref 0.10–4.00)

## 2014-04-17 ENCOUNTER — Telehealth: Payer: Self-pay | Admitting: Cardiovascular Disease

## 2014-04-17 NOTE — Telephone Encounter (Signed)
[facility] for [place]

## 2014-04-17 NOTE — Telephone Encounter (Signed)
New message     Talk to a nurse regarding nexium.  Ins company want to know why is pt on this medication---pt needs prior authoriztion[facility].  Please let pt know it has been taken care of

## 2014-04-19 ENCOUNTER — Telehealth: Payer: Self-pay | Admitting: *Deleted

## 2014-04-19 ENCOUNTER — Other Ambulatory Visit: Payer: Self-pay | Admitting: *Deleted

## 2014-04-19 MED ORDER — ESOMEPRAZOLE MAGNESIUM 40 MG PO CPDR: 40.0000 mg | DELAYED_RELEASE_CAPSULE | Freq: Every day | ORAL | Status: DC

## 2014-04-19 NOTE — Telephone Encounter (Signed)
PA to [employer] for nexium

## 2014-04-30 MED ORDER — PANTOPRAZOLE SODIUM 40 MG PO TBEC: 40.0000 mg | DELAYED_RELEASE_TABLET | Freq: Every day | ORAL | Status: DC

## 2014-04-30 NOTE — Telephone Encounter (Signed)
Can call in protonix 20 mg daily then

## 2014-04-30 NOTE — Addendum Note (Signed)
Addended by: Fernande Boyden on: 04/30/2014 05:33 PM   Modules accepted: Orders, Medications

## 2014-04-30 NOTE — Telephone Encounter (Signed)
Barry Mcdonald denied nexium:  "there is no documentation of intolerance side effects or a failure to achieve satisfactory effect from pantoprazole"

## 2014-07-25 ENCOUNTER — Telehealth: Payer: Self-pay | Admitting: Cardiovascular Disease

## 2014-07-25 DIAGNOSIS — Z79899 Other long term (current) drug therapy: Secondary | ICD-10-CM

## 2014-07-25 NOTE — Telephone Encounter (Signed)
Left pt a message to call back. 

## 2014-07-25 NOTE — Telephone Encounter (Signed)
New Message  Pt called states that his ankles are swollen and he isn't sure why. Pt requests a call back to discuss where the swelling is coming from. Please call

## 2014-07-25 NOTE — Telephone Encounter (Signed)
Pt notice that for a month that  his shoes gets tight as the day progresses. "Ankles and feet slightly swollen". Pt states the swelling does not get better during the night because he is up to the restroom a lot; pt denies SOB . Pt said that he is watching the salt intake in his diet . Pt thinks he needs for Dr Johnsie Cancel to order a medication with more diuretic than Hyzaar, because he is retaining  fluids and this medication is not helping that. Pt will check his BP this week and see what it is. He will call results to this office next week. Pt is aware that Md is not in the office this week pt states that it will be okay to be called next week when Dr. Johnsie Cancel returns.

## 2014-07-27 MED ORDER — FUROSEMIDE 20 MG PO TABS: 20.0000 mg | ORAL_TABLET | Freq: Every day | ORAL | Status: DC

## 2014-07-27 MED ORDER — LOSARTAN POTASSIUM 100 MG PO TABS: 100.0000 mg | ORAL_TABLET | Freq: Every day | ORAL | Status: DC

## 2014-07-27 NOTE — Telephone Encounter (Signed)
Can stop hyzaaar  Start cozaar 100mg  daily and lasix 20 mg daily  BMET 2 weeks after making change

## 2014-07-27 NOTE — Telephone Encounter (Signed)
Advised patient of Dr. Kyla Balzarine orders.  Request to send rx for 30 days to [facility]. He will call back next week if working well to have refills [facility] to [facility]. BMET scheduled for 10/6.

## 2014-08-14 ENCOUNTER — Other Ambulatory Visit (INDEPENDENT_AMBULATORY_CARE_PROVIDER_SITE_OTHER): Payer: No Typology Code available for payment source | Admitting: *Deleted

## 2014-08-14 DIAGNOSIS — Z79899 Other long term (current) drug therapy: Secondary | ICD-10-CM

## 2014-08-14 LAB — BASIC METABOLIC PANEL
BUN: 13 mg/dL (ref 6–23)
CHLORIDE: 106 meq/L (ref 96–112)
CO2: 28 meq/L (ref 19–32)
CREATININE: 1 mg/dL (ref 0.4–1.5)
Calcium: 9.1 mg/dL (ref 8.4–10.5)
GFR: 84.39 mL/min (ref >60.00)
Glucose, Bld: 92 mg/dL (ref 70–99)
Potassium: 4.1 mEq/L (ref 3.5–5.1)
Sodium: 139 mEq/L (ref 135–145)

## 2014-08-17 ENCOUNTER — Other Ambulatory Visit: Payer: Self-pay | Admitting: *Deleted

## 2014-08-17 MED ORDER — FUROSEMIDE 20 MG PO TABS: 20.0000 mg | ORAL_TABLET | Freq: Every day | ORAL | Status: DC

## 2014-08-17 MED ORDER — LOSARTAN POTASSIUM 100 MG PO TABS
100.0000 mg | ORAL_TABLET | Freq: Every day | ORAL | Status: DC
Start: 2014-08-17 — End: 2014-12-27

## 2014-10-25 ENCOUNTER — Other Ambulatory Visit: Payer: Self-pay | Admitting: *Deleted

## 2014-10-25 MED ORDER — PANTOPRAZOLE SODIUM 40 MG PO TBEC: 40.0000 mg | DELAYED_RELEASE_TABLET | Freq: Every day | ORAL | Status: DC

## 2014-12-27 ENCOUNTER — Other Ambulatory Visit: Payer: Self-pay

## 2014-12-27 MED ORDER — PANTOPRAZOLE SODIUM 40 MG PO TBEC: 40.0000 mg | DELAYED_RELEASE_TABLET | Freq: Every day | ORAL | Status: DC

## 2014-12-27 MED ORDER — FUROSEMIDE 20 MG PO TABS: 20.0000 mg | ORAL_TABLET | Freq: Every day | ORAL | Status: DC

## 2014-12-27 MED ORDER — LOSARTAN POTASSIUM 100 MG PO TABS: 100.0000 mg | ORAL_TABLET | Freq: Every day | ORAL | Status: DC

## 2015-04-25 ENCOUNTER — Telehealth: Payer: Self-pay | Admitting: *Deleted

## 2015-04-25 ENCOUNTER — Encounter: Payer: Self-pay | Admitting: *Deleted

## 2015-04-25 NOTE — Telephone Encounter (Signed)
called to update fm hx & status, spoke to pt.Marland KitchenMarland Kitchen

## 2015-04-28 NOTE — Progress Notes (Signed)
Patient ID: Barry Mcdonald, male   DOB: 1951-07-26, 64 y.o.   MRN: [id number] 63 y.o.  had atypical chest pain in the past with a normal cath 2013 . He has significant hypertension. He has had pretty good results with the Tekturna 320/12.5. Was on amlodipine that was ineffective. Now on Cozaar.  More edema and lasix added instead of HcTZ . Blood pressure seems to be under reasonable control. He needs to lose about 10 or 15 pounds. He continues to have some excess salt in his diet. He is ambulating without difficulty. He has had resolution of his lower extremity edema. He does not have any significant chest pain. He quit using smokeless tobacco two years ago..  Cath in 2003 reviewed and no significant CAD   Was on proscar for prostate but has not f/u with [facility] Bothered by ventral hernia had him see [facility] 04/2014 and he agreed no surgery needed   Needs f/u blood work  ROS: Denies fever, malais, weight loss, blurry vision, decreased visual acuity, cough, sputum, SOB, hemoptysis, pleuritic pain, palpitaitons, heartburn, abdominal pain, melena, lower extremity edema, claudication, or rash.  All other systems reviewed and negative  General: Affect appropriate Overweight white male  HEENT: normal Neck supple with no adenopathy JVP normal no bruits no thyromegaly Lungs clear with no wheezing and good diaphragmatic motion Heart:  S1/S2 no murmur, no rub, gallop or click PMI normal Abdomen: benighn, BS positve, no tenderness, no AAA mild diastasis rectic  no bruit.  No HSM or HJR Distal pulses intact with no bruits No edema Neuro non-focal Skin warm and dry No muscular weakness   Current Outpatient Prescriptions  Medication Sig Dispense Refill  . alfuzosin (UROXATRAL) 10 MG 24 hr tablet Take 10 mg by mouth daily.    . furosemide (LASIX) 20 MG tablet Take 1 tablet (20 mg total) by mouth daily. 90 tablet 3  . losartan (COZAAR) 100 MG tablet Take 1 tablet (100 mg total) by mouth daily.  90 tablet 3  . pantoprazole (PROTONIX) 40 MG tablet Take 1 tablet (40 mg total) by mouth daily. 90 tablet 3   No current facility-administered medications for this visit.    Allergies  Review of patient's allergies indicates no known allergies.  Electrocardiogram:  03/15/14  SR rate 64  Normal ECG  04/29/15  SR rate 67  Normal ECG   Assessment and Plan HTN:  Well controlled.  Continue current medications and low sodium Dash type diet.   Edema:  Low salt diet continue diuretic  BMET today GERD:  Continue proton pump inhibiter and low carb diet GI:  Rectus diastasis stable no need for surgery   [name]

## 2015-04-29 ENCOUNTER — Encounter: Payer: Self-pay | Admitting: Cardiovascular Disease

## 2015-04-29 ENCOUNTER — Ambulatory Visit (INDEPENDENT_AMBULATORY_CARE_PROVIDER_SITE_OTHER): Payer: 59 | Admitting: Cardiovascular Disease

## 2015-04-29 VITALS — BP 140/80 | HR 67 | Ht 68.0 in | Wt 225.0 lb

## 2015-04-29 DIAGNOSIS — Z79899 Other long term (current) drug therapy: Secondary | ICD-10-CM | POA: Diagnosis not present

## 2015-04-29 DIAGNOSIS — I159 Secondary hypertension, unspecified: Secondary | ICD-10-CM

## 2015-04-29 LAB — BASIC METABOLIC PANEL
BUN: 17 mg/dL (ref 6–23)
CHLORIDE: 104 meq/L (ref 96–112)
CO2: 28 mEq/L (ref 19–32)
Calcium: 9.4 mg/dL (ref 8.4–10.5)
Creatinine, Ser: 0.99 mg/dL (ref 0.40–1.50)
GFR: 81.25 mL/min (ref >60.00)
Glucose, Bld: 132 mg/dL — ABNORMAL HIGH (ref 70–99)
Potassium: 3.7 mEq/L (ref 3.5–5.1)
Sodium: 136 mEq/L (ref 135–145)

## 2015-04-29 MED ORDER — LOSARTAN POTASSIUM 100 MG PO TABS: 100.0000 mg | ORAL_TABLET | Freq: Every day | ORAL | Status: DC

## 2015-04-29 MED ORDER — FUROSEMIDE 20 MG PO TABS: 20.0000 mg | ORAL_TABLET | Freq: Every day | ORAL | Status: DC

## 2015-04-29 NOTE — Patient Instructions (Signed)
Your physician wants you to follow-up in: [place] will receive a reminder letter in the mail two months in advance. If you don't receive a letter, please call our office to schedule the follow-up appointment.  Your physician recommends that you continue on your current medications as directed. Please refer to the Current Medication list given to you today.  Your physician recommends that you return for lab work in:  TODAY  BMET

## 2015-05-02 ENCOUNTER — Other Ambulatory Visit: Payer: Self-pay

## 2015-05-02 MED ORDER — PANTOPRAZOLE SODIUM 40 MG PO TBEC: 40.0000 mg | DELAYED_RELEASE_TABLET | Freq: Every day | ORAL | Status: DC

## 2015-11-13 ENCOUNTER — Telehealth: Payer: Self-pay | Admitting: Cardiovascular Disease

## 2015-11-13 DIAGNOSIS — R062 Wheezing: Secondary | ICD-10-CM

## 2015-11-13 DIAGNOSIS — J4520 Mild intermittent asthma, uncomplicated: Secondary | ICD-10-CM

## 2015-11-13 NOTE — Telephone Encounter (Signed)
New problem   Pt want to be recommend to an asthma doctor. Please advise

## 2015-11-13 NOTE — Telephone Encounter (Addendum)
Dr. Johnsie Cancel recommends referral to pulmonary. Placed order and patient is aware that someone will be calling him to setup an appointment.  Will forward to Dr. Johnsie Cancel for recommendations.

## 2015-12-10 ENCOUNTER — Institutional Professional Consult (permissible substitution): Payer: Self-pay | Admitting: Internal Medicine

## 2015-12-23 ENCOUNTER — Other Ambulatory Visit (INDEPENDENT_AMBULATORY_CARE_PROVIDER_SITE_OTHER): Payer: BLUE CROSS/BLUE SHIELD

## 2015-12-23 ENCOUNTER — Ambulatory Visit (INDEPENDENT_AMBULATORY_CARE_PROVIDER_SITE_OTHER)
Admission: RE | Admit: 2015-12-23 | Discharge: 2015-12-23 | Disposition: A | Discharge status: Home / Self Care | Payer: BLUE CROSS/BLUE SHIELD | Source: Ambulatory Visit | Attending: Internal Medicine | Admitting: Internal Medicine

## 2015-12-23 ENCOUNTER — Other Ambulatory Visit: Payer: Self-pay | Admitting: Internal Medicine

## 2015-12-23 ENCOUNTER — Ambulatory Visit (INDEPENDENT_AMBULATORY_CARE_PROVIDER_SITE_OTHER): Payer: BLUE CROSS/BLUE SHIELD | Admitting: Internal Medicine

## 2015-12-23 ENCOUNTER — Encounter: Payer: Self-pay | Admitting: Internal Medicine

## 2015-12-23 VITALS — BP 132/84 | HR 97 | Ht 68.0 in | Wt 231.0 lb

## 2015-12-23 DIAGNOSIS — R05 Cough: Secondary | ICD-10-CM

## 2015-12-23 DIAGNOSIS — R058 Other specified cough: Secondary | ICD-10-CM

## 2015-12-23 LAB — CBC WITH DIFFERENTIAL/PLATELET
BASOS ABS: 0.1 10*3/uL (ref 0.0–0.1)
BASOS PCT: 0.6 % (ref 0.0–3.0)
Eosinophils Absolute: 0.4 10*3/uL (ref 0.0–0.7)
Eosinophils Relative: 4.1 % (ref 0.0–5.0)
HEMATOCRIT: 47.8 % (ref 39.0–52.0)
HEMOGLOBIN: 16 g/dL (ref 13.0–17.0)
LYMPHS PCT: 28.1 % (ref 12.0–46.0)
Lymphs Abs: 2.5 10*3/uL (ref 0.7–4.0)
MCHC: 33.6 g/dL (ref 30.0–36.0)
MCV: 95.1 fl (ref 78.0–100.0)
MONOS PCT: 7.8 % (ref 3.0–12.0)
Monocytes Absolute: 0.7 10*3/uL (ref 0.1–1.0)
NEUTROS ABS: 5.3 10*3/uL (ref 1.4–7.7)
Neutrophils Relative %: 59.4 % (ref 43.0–77.0)
PLATELETS: 237 10*3/uL (ref 150.0–400.0)
RBC: 5.02 Mil/uL (ref 4.22–5.81)
RDW: 12.9 % (ref 11.5–15.5)
WBC: 8.9 10*3/uL (ref 4.0–10.5)

## 2015-12-23 LAB — NITRIC OXIDE: NITRIC OXIDE: 6

## 2015-12-23 NOTE — Progress Notes (Signed)
Subjective:    Patient ID: Barry Mcdonald, male    DOB: Sep 26, 1951  MRN: [id number]  HPI  38 yowm quit smoking in 1985 and was told asthma as child but no memory of any symptoms at all and able to do sports fine and continued to enjoy good ex tol and only notes doe x steep hills x around 2007 but no worse since first noted (never did steep hills before)  Referred by Dr Eden Emms 12/23/2015 for noisy breathing.    12/23/2015 1st [facility] office visit/ Toini Failla   Chief Complaint  Patient presents with  . Pulmonary Consult    Referred by Dr. Charlton Haws. Pt c/o wheezing. He had asthma as a child.   pattern of sinus infections recurrent  X 10 years > being eval by ENT for dev septum  The wheezing has been appreciated by others more than pt but does hear wheezing noises when lies down at night but does not disturb sleep or flare in early am Taking ppi qd not ac  Not limited by breathing from desired activities    No obvious other patterns in day to day or daytime variabilty or assoc excess/ purulent sputum or mucus plugs  or cp or chest tightness, subjective wheeze or  overt    hb symptoms. No unusual exp hx or h/o childhood pna/  or knowledge of premature birth.  Sleeping ok without nocturnal  or early am exacerbation  of respiratory  c/o's or need for noct saba. Also denies any obvious fluctuation of symptoms with weather or environmental changes or other aggravating or alleviating factors except as outlined above   Current Medications, Allergies, Complete Past Medical History, Past Surgical History, Family History, and Social History were reviewed in [employer] record.             Review of Systems  Constitutional: Negative for fever, chills, activity change, appetite change and unexpected weight change.  HENT: Negative for congestion, dental problem, postnasal drip, rhinorrhea, sneezing, sore throat, trouble swallowing and voice change.   Eyes: Negative  for visual disturbance.  Respiratory: Negative for cough, choking and shortness of breath.   Cardiovascular: Negative for chest pain and leg swelling.  Gastrointestinal: Negative for nausea, vomiting and abdominal pain.  Genitourinary: Negative for difficulty urinating.  Musculoskeletal: Negative for arthralgias.  Skin: Negative for rash.  Psychiatric/Behavioral: Negative for behavioral problems and confusion.       Objective:   Physical Exam  amb wm with nasal tone to voice/ audible but subtle pseudowheezes  Wt Readings from Last 3 Encounters:  12/23/15 231 lb (104.781 kg)  04/29/15 225 lb (102.059 kg)  04/10/14 221 lb (100.245 kg)    Vital signs reviewed  HEENT: nl dentition,  and oropharynx. Nl external ear canals without cough reflex- moderate to severe  non-specific turbinate edema bilaterally   NECK :  without JVD/Nodes/TM/ nl carotid upstrokes bilaterally   LUNGS: no acc muscle use,  Nl contour chest which is clear to A and P bilaterally without cough on insp or exp maneuvers   CV:  RRR  no s3 or murmur or increase in P2, no edema   ABD:  Markedly obese but  soft and nontender with nl inspiratory excursion in the supine position. No bruits or organomegaly, bowel sounds nl  MS:  Nl gait/ ext warm without deformities, calf tenderness, cyanosis or clubbing No obvious joint restrictions   SKIN: warm and dry without lesions    NEURO:  alert, approp, nl sensorium with  no motor deficits     Labs 12/23/2015  Allergy profile ordered    CXR PA and Lateral:   12/23/2015 :    I personally reviewed images and agree with radiology impression as follows:    Mild chronic bronchitic and smoking related changes. There is no acute cardiopulmonary abnormality.      Assessment & Plan:

## 2015-12-23 NOTE — Patient Instructions (Addendum)
Please remember to go to the lab and x-ray department downstairs for your tests - we will call you with the results when they are available - if you have a significant allergy I will be directing you to see Dr Neldon Mc in [place]   Remember to take protonix   x 30-60 min before first meal of the day   You have no evidence of any form of asthma at present and strongly suspect your symptoms are a reflex to your sinus problems  Pulmonary follow up can be as needed if your symptoms don't improve after you complete your ENT evaluation and treatment.

## 2015-12-23 NOTE — Assessment & Plan Note (Addendum)
Spirometry 12/23/2015  wnl  - allergy profile 12/23/2015 >  Eos 0.4/ IgE pending   Classic Upper airway cough syndrome, so named because it's frequently impossible to sort out how much is  CR/sinusitis with freq throat clearing (which can be related to primary GERD)   vs  causing  secondary (" extra esophageal")  GERD from wide swings in gastric pressure that occur with throat clearing, often  promoting self use of mint and menthol lozenges that reduce the lower esophageal sphincter tone and exacerbate the problem further in a cyclical fashion.   These are the same pts (now being labeled as having "irritable larynx syndrome" by some cough centers) who not infrequently have a history of having failed to tolerate ace inhibitors,  dry powder inhalers or biphosphonates or report having atypical reflux symptoms that don't respond to standard doses of PPI , and are easily confused as having aecopd or asthma flares by even experienced allergists/ pulmonologists.   For now needs to direct attention to treating sinus dz per Ent and await allergy profile to see if he would benefit from seeing Dr Neldon Mc and should also continue GERD rx/ diet longterm as this is frequently a co-existing problem and he is at risk.   Total time devoted to counseling  = 35/78m review case with pt/ discussion of options/alternatives/ personally creating in presence of pt  then going over specific  Instructions directly with the pt including how to use all of the meds but in particular covering each new medication in detail (see avs)

## 2015-12-31 LAB — RESPIRATORY ALLERGY PROFILE REGION II ~~LOC~~
Allergen, Cedar tree, t12: <0.1 kU/L
Allergen, Comm Silver Birch, t9: <0.1 kU/L
Allergen, Mouse Urine Protein, e78: <0.1 kU/L
Alternaria Alternata: <0.1 kU/L
Aspergillus fumigatus, m3: <0.1 kU/L
Common Ragweed: <0.1 kU/L
IgE (Immunoglobulin E), Serum: 72 kU/L (ref <115)
Penicillium Notatum: <0.1 kU/L
Rough Pigweed  IgE: <0.1 kU/L
Sheep Sorrel IgE: <0.1 kU/L

## 2016-01-08 HISTORY — PX: NASAL SEPTOPLASTY W/ TURBINOPLASTY: SHX2070

## 2016-03-04 ENCOUNTER — Telehealth: Payer: Self-pay | Admitting: Cardiovascular Disease

## 2016-03-04 NOTE — Telephone Encounter (Signed)
New Message  Pt requested to speak w/ rN- would not specify nature of call- sched f.u for July/2017. Please call back and discuss.

## 2016-03-04 NOTE — Telephone Encounter (Signed)
Left message to call back  

## 2016-03-04 NOTE — Telephone Encounter (Signed)
Patient wanted guidance from Dr. Johnsie Cancel on who to see for hand surgery for basel arthritis. Will forward to Dr. Johnsie Cancel for recommendation.

## 2016-03-04 NOTE — Telephone Encounter (Signed)
[place] is a good Copy

## 2016-03-04 NOTE — Telephone Encounter (Signed)
Patient called back. Informed patient about Dr. Kyla Balzarine recommendations. Patient verbalized understanding.

## 2016-05-11 NOTE — Progress Notes (Signed)
Patient ID: Barry Mcdonald, male   DOB: 1950-12-02, 65 y.o.   MRN: [id number] 64 y.o.  had atypical chest pain in the past with a normal cath 2013 . He has significant hypertension. He has had pretty good results with the [employer]ekturna 320/12.5. Was on amlodipine that was ineffective. Now on Cozaar.  More edema and lasix added instead of HcTZ . Blood pressure seems to be under reasonable control. He needs to lose about 10 or 15 pounds. He continues to have some excess salt in his diet. He is ambulating without difficulty. He has had resolution of his lower extremity edema. He does not have any significant chest pain. He quit using smokeless tobacco two years ago..   Cath in 2003 reviewed and no significant CAD   Was on proscar for prostate but has not f/u with Grapey Bothered by ventral hernia had him see Rosenbower 04/2014 and he agreed no surgery needed   Has new girlfriend that has him exercising using fit bit and eating low Carb weight is down about 28 lbs   ROS: Denies fever, malais, weight loss, blurry vision, decreased visual acuity, cough, sputum, SOB, hemoptysis, pleuritic pain, palpitaitons, heartburn, abdominal pain, melena, lower extremity edema, claudication, or rash.  All other systems reviewed and negative  General: Affect appropriate Overweight white male  HEENT: normal Neck supple with no adenopathy JVP normal no bruits no thyromegaly Lungs clear with no wheezing and good diaphragmatic motion Heart:  S1/S2 no murmur, no rub, gallop or click PMI normal Abdomen: benighn, BS positve, no tenderness, no AAA mild diastasis rectic  no bruit.  No HSM or HJR Distal pulses intact with no bruits No edema Neuro non-focal Skin warm and dry No muscular weakness   Current Outpatient Prescriptions  Medication Sig Dispense Refill  . alfuzosin (UROXATRAL) 10 MG 24 hr tablet Take 10 mg by mouth daily.    . furosemide (LASIX) 20 MG tablet Take 1 tablet (20 mg total) by mouth daily. 90  tablet 3  . pantoprazole (PROTONIX) 40 MG tablet Take 1 tablet (40 mg total) by mouth daily. 90 tablet 3  . losartan-hydrochlorothiazide (HYZAAR) 100-12.5 MG tablet Take 1 tablet by mouth daily. 90 tablet 3   No current facility-administered medications for this visit.    Allergies  Review of patient's allergies indicates no known allergies.  Electrocardiogram:  03/15/14  SR rate 64  Normal ECG  04/29/15  SR rate 67  Normal ECG   Assessment and Plan HTN:  Well controlled.  Continue current medications and low sodium Dash type diet.   Edema:  Low salt diet continue diuretic  Wants to be on hyzaar so changed  Lab Results  Component Value Date   CREATININE 0.99 04/29/2015   BUN 17 04/29/2015   NA 136 04/29/2015   K 3.7 04/29/2015   CL 104 04/29/2015   CO2 28 04/29/2015    GERD:  Continue proton pump inhibiter and low carb diet GI:  Rectus diastasis stable no need for surgery better with weight loss  [name]

## 2016-05-13 ENCOUNTER — Ambulatory Visit (INDEPENDENT_AMBULATORY_CARE_PROVIDER_SITE_OTHER): Payer: BLUE CROSS/BLUE SHIELD | Admitting: Cardiovascular Disease

## 2016-05-13 ENCOUNTER — Encounter: Payer: Self-pay | Admitting: Cardiovascular Disease

## 2016-05-13 VITALS — BP 140/70 | HR 80 | Ht 68.0 in | Wt 203.0 lb

## 2016-05-13 DIAGNOSIS — Z09 Encounter for follow-up examination after completed treatment for conditions other than malignant neoplasm: Secondary | ICD-10-CM | POA: Diagnosis not present

## 2016-05-13 DIAGNOSIS — E782 Mixed hyperlipidemia: Secondary | ICD-10-CM | POA: Diagnosis not present

## 2016-05-13 DIAGNOSIS — I10 Essential (primary) hypertension: Secondary | ICD-10-CM | POA: Diagnosis not present

## 2016-05-13 LAB — HEPATIC FUNCTION PANEL
ALK PHOS: 66 U/L (ref 40–115)
ALT: 14 U/L (ref 9–46)
AST: 13 U/L (ref 10–35)
Albumin: 3.6 g/dL (ref 3.6–5.1)
BILIRUBIN DIRECT: 0.2 mg/dL (ref <=0.2)
BILIRUBIN INDIRECT: 0.7 mg/dL (ref 0.2–1.2)
BILIRUBIN TOTAL: 0.9 mg/dL (ref 0.2–1.2)
Total Protein: 5.8 g/dL — ABNORMAL LOW (ref 6.1–8.1)

## 2016-05-13 LAB — LIPID PANEL
CHOL/HDL RATIO: 4.4 ratio (ref <=5.0)
CHOLESTEROL: 128 mg/dL (ref 125–200)
HDL: 29 mg/dL — ABNORMAL LOW (ref >=40)
LDL Cholesterol: 82 mg/dL (ref <130)
Triglycerides: 86 mg/dL (ref <150)
VLDL: 17 mg/dL (ref <30)

## 2016-05-13 LAB — HEMOGLOBIN A1C
Hgb A1c MFr Bld: 5.2 % (ref <5.7)
Mean Plasma Glucose: 103 mg/dL

## 2016-05-13 MED ORDER — PANTOPRAZOLE SODIUM 40 MG PO TBEC: 40.0000 mg | DELAYED_RELEASE_TABLET | Freq: Every day | ORAL | Status: DC

## 2016-05-13 MED ORDER — LOSARTAN POTASSIUM-HCTZ 100-12.5 MG PO TABS: 1.0000 | ORAL_TABLET | Freq: Every day | ORAL | Status: DC

## 2016-05-13 MED ORDER — FUROSEMIDE 20 MG PO TABS: 20.0000 mg | ORAL_TABLET | Freq: Every day | ORAL | Status: DC

## 2016-05-13 NOTE — Patient Instructions (Addendum)
Medication Instructions:  Your physician recommends that you continue on your current medications as directed. Please refer to the Current Medication list given to you today.  Labwork: Your physician recommends that you have lab work today. Lipid, Liver, and HbgA1c   Testing/Procedures: NONE  Follow-Up: Your physician wants you to follow-up in: 12 months with Dr. Johnsie Cancel. You will receive a reminder letter in the mail two months in advance. If you don't receive a letter, please call our office to schedule the follow-up appointment.   If you need a refill on your cardiac medications before your next appointment, please call your pharmacy.

## 2016-10-30 ENCOUNTER — Other Ambulatory Visit: Payer: Self-pay | Admitting: Cardiovascular Disease

## 2017-04-29 ENCOUNTER — Encounter: Payer: Self-pay | Admitting: Cardiovascular Disease

## 2017-05-11 NOTE — Progress Notes (Signed)
Patient ID: Barry Mcdonald, male   DOB: [birth year], 66 y.o.   MRN: [id number] 65 y.o.  had atypical chest pain in the past with a normal cath 2013 . He has significant hypertension.  Was on amlodipine that was ineffective. Now on Cozaar.  More edema and lasix added instead of HcTZ . Blood pressure seems to be under reasonable control. Edema better on diuretic . He does not have any significant chest pain. He quit using smokeless tobacco 3 years ago..   Cath in 2013 reviewed and no significant CAD   Was on proscar for prostate but has not f/u with Grapey Bothered by ventral hernia had him see Rosenbower 04/2014 and he agreed no surgery needed   Has new girlfriend that has him exercising using fit bit and eating low Carb Very attentive to calories burnt and intake  Taking lasix as needed for edema. Going to beach and [place] a lot    ROS: Denies fever, malais, weight loss, blurry vision, decreased visual acuity, cough, sputum, SOB, hemoptysis, pleuritic pain, palpitaitons, heartburn, abdominal pain, melena, lower extremity edema, claudication, or rash.  All other systems reviewed and negative  General: BP (!) 146/84   Pulse (!) 55   Ht 5' 7.5" (1.715 m)   Wt 208 lb 12.8 oz (94.7 kg)   BMI 32.22 kg/m  Affect appropriate Overweight white male  HEENT: normal Neck supple with no adenopathy JVP normal no bruits no thyromegaly Lungs clear with no wheezing and good diaphragmatic motion Heart:  S1/S2 no murmur, no rub, gallop or click PMI normal Abdomen: benighn, BS positve, no tenderness, no AAA no bruit.  No HSM or HJR Distal pulses intact with no bruits Plus one edema Neuro non-focal Skin warm and dry No muscular weakness    Current Outpatient Prescriptions  Medication Sig Dispense Refill  . alfuzosin (UROXATRAL) 10 MG 24 hr tablet Take 10 mg by mouth daily.    Marland Kitchen losartan-hydrochlorothiazide (HYZAAR) 100-12.5 MG tablet Take 1 tablet by mouth daily. 90 tablet 3  .  pantoprazole (PROTONIX) 40 MG tablet Take 1 tablet (40 mg total) by mouth daily. 90 tablet 3  . furosemide (LASIX) 20 MG tablet Take 1 tablet (20 mg total) by mouth daily. 90 tablet 3   No current facility-administered medications for this visit.     Allergies  Patient has no known allergies.  Electrocardiogram:  03/15/14  SR rate 64  Normal ECG  04/29/15  SR rate 67  Normal ECG  05/17/17  SR rate 55 normal   Assessment and Plan HTN:  Well controlled.  Continue current medications and low sodium Dash type diet.   Edema:  Low salt diet continue diuretic  Wants to be on hyzaar so changed  Lab Results  Component Value Date   CREATININE 0.99 04/29/2015   BUN 17 04/29/2015   NA 136 04/29/2015   K 3.7 04/29/2015   CL 104 04/29/2015   CO2 28 04/29/2015    GERD:  Continue proton pump inhibiter and low carb diet GI:  Rectus diastasis stable no need for surgery better with weight loss  [name]

## 2017-05-17 ENCOUNTER — Encounter: Payer: Self-pay | Admitting: Cardiovascular Disease

## 2017-05-17 ENCOUNTER — Ambulatory Visit (INDEPENDENT_AMBULATORY_CARE_PROVIDER_SITE_OTHER): Payer: BLUE CROSS/BLUE SHIELD | Admitting: Cardiovascular Disease

## 2017-05-17 VITALS — BP 146/84 | HR 55 | Ht 67.5 in | Wt 208.8 lb

## 2017-05-17 DIAGNOSIS — I10 Essential (primary) hypertension: Secondary | ICD-10-CM | POA: Diagnosis not present

## 2017-05-17 MED ORDER — LOSARTAN POTASSIUM-HCTZ 100-12.5 MG PO TABS: 1.0000 | ORAL_TABLET | Freq: Every day | ORAL | 3 refills | Status: DC

## 2017-05-17 MED ORDER — PANTOPRAZOLE SODIUM 40 MG PO TBEC: 40.0000 mg | DELAYED_RELEASE_TABLET | Freq: Every day | ORAL | 3 refills | Status: DC

## 2017-05-17 MED ORDER — FUROSEMIDE 20 MG PO TABS: 20.0000 mg | ORAL_TABLET | Freq: Every day | ORAL | 3 refills | Status: DC

## 2017-05-17 NOTE — Patient Instructions (Addendum)

## 2017-05-18 IMAGING — DX DG CHEST 2V
2 series · 2 of 2 positions shown · non-contrast
Comparison: None in PACs

CLINICAL DATA: Routine checkup; history of hyperlipidemia,
hypertension, former smoker.

EXAM:
CHEST  2 VIEW

[chest pa]
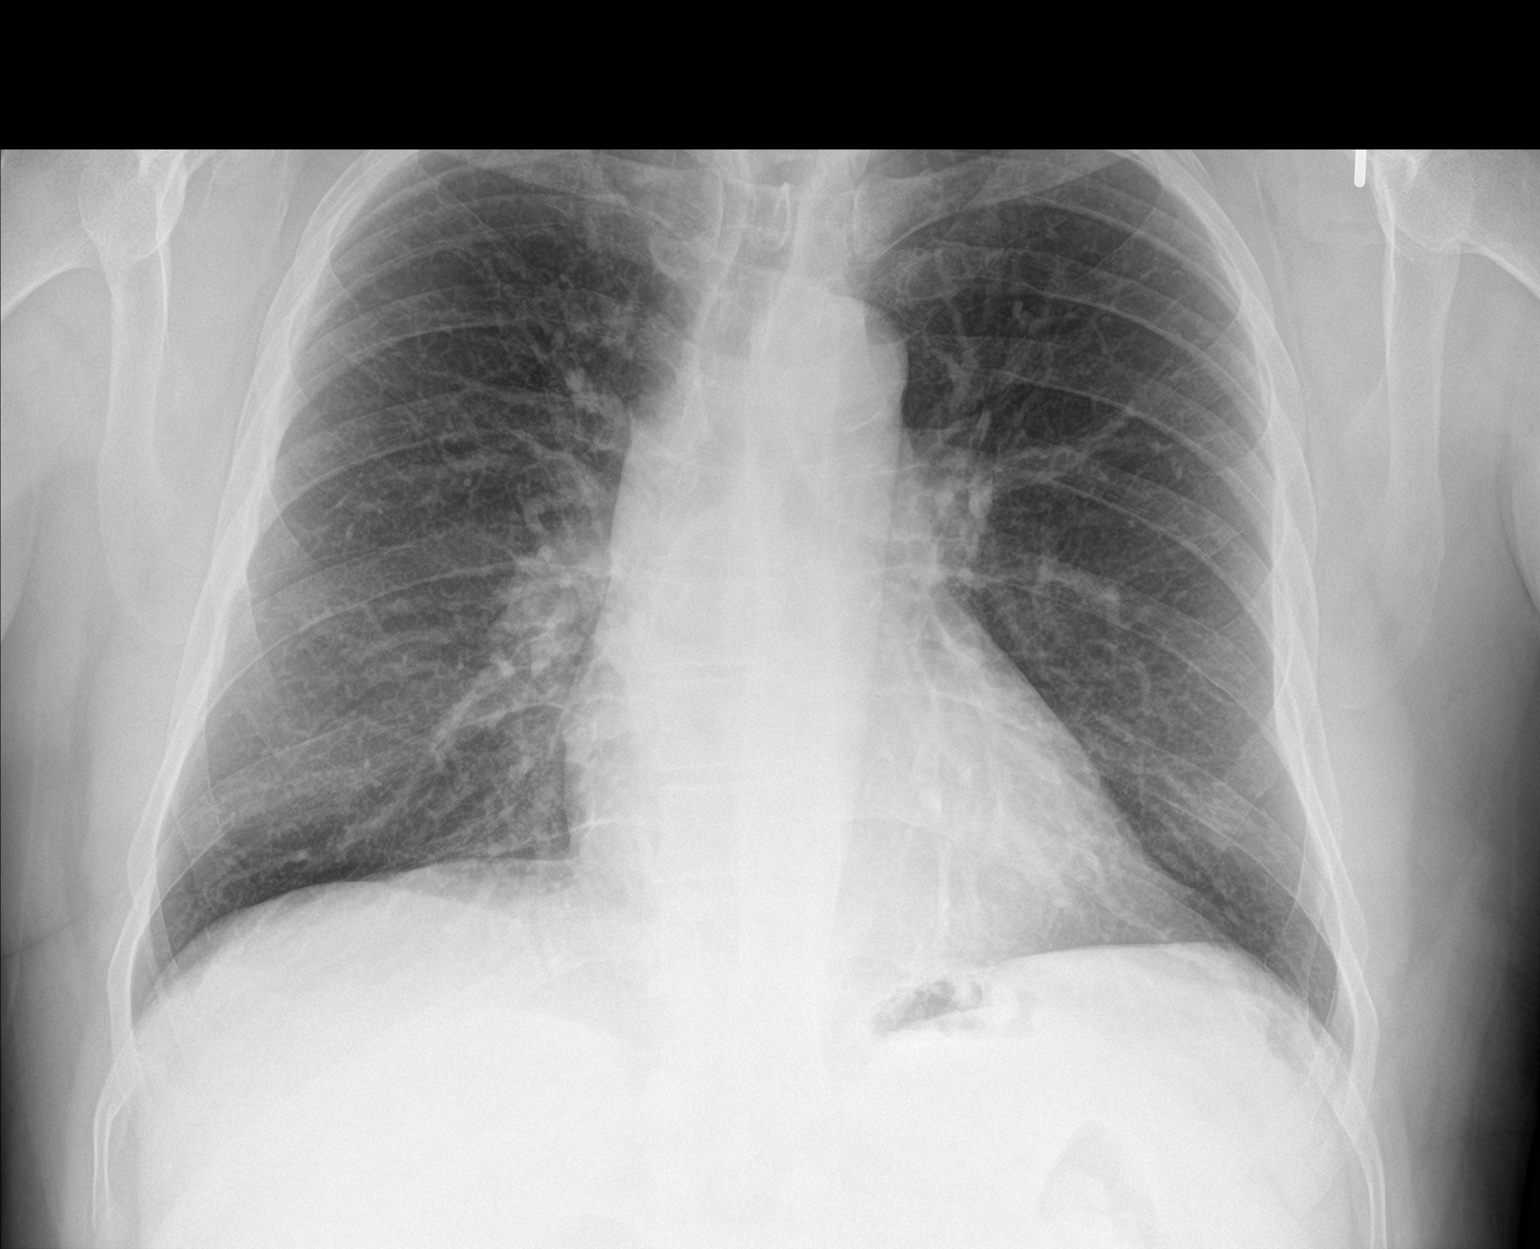

[chest lat]
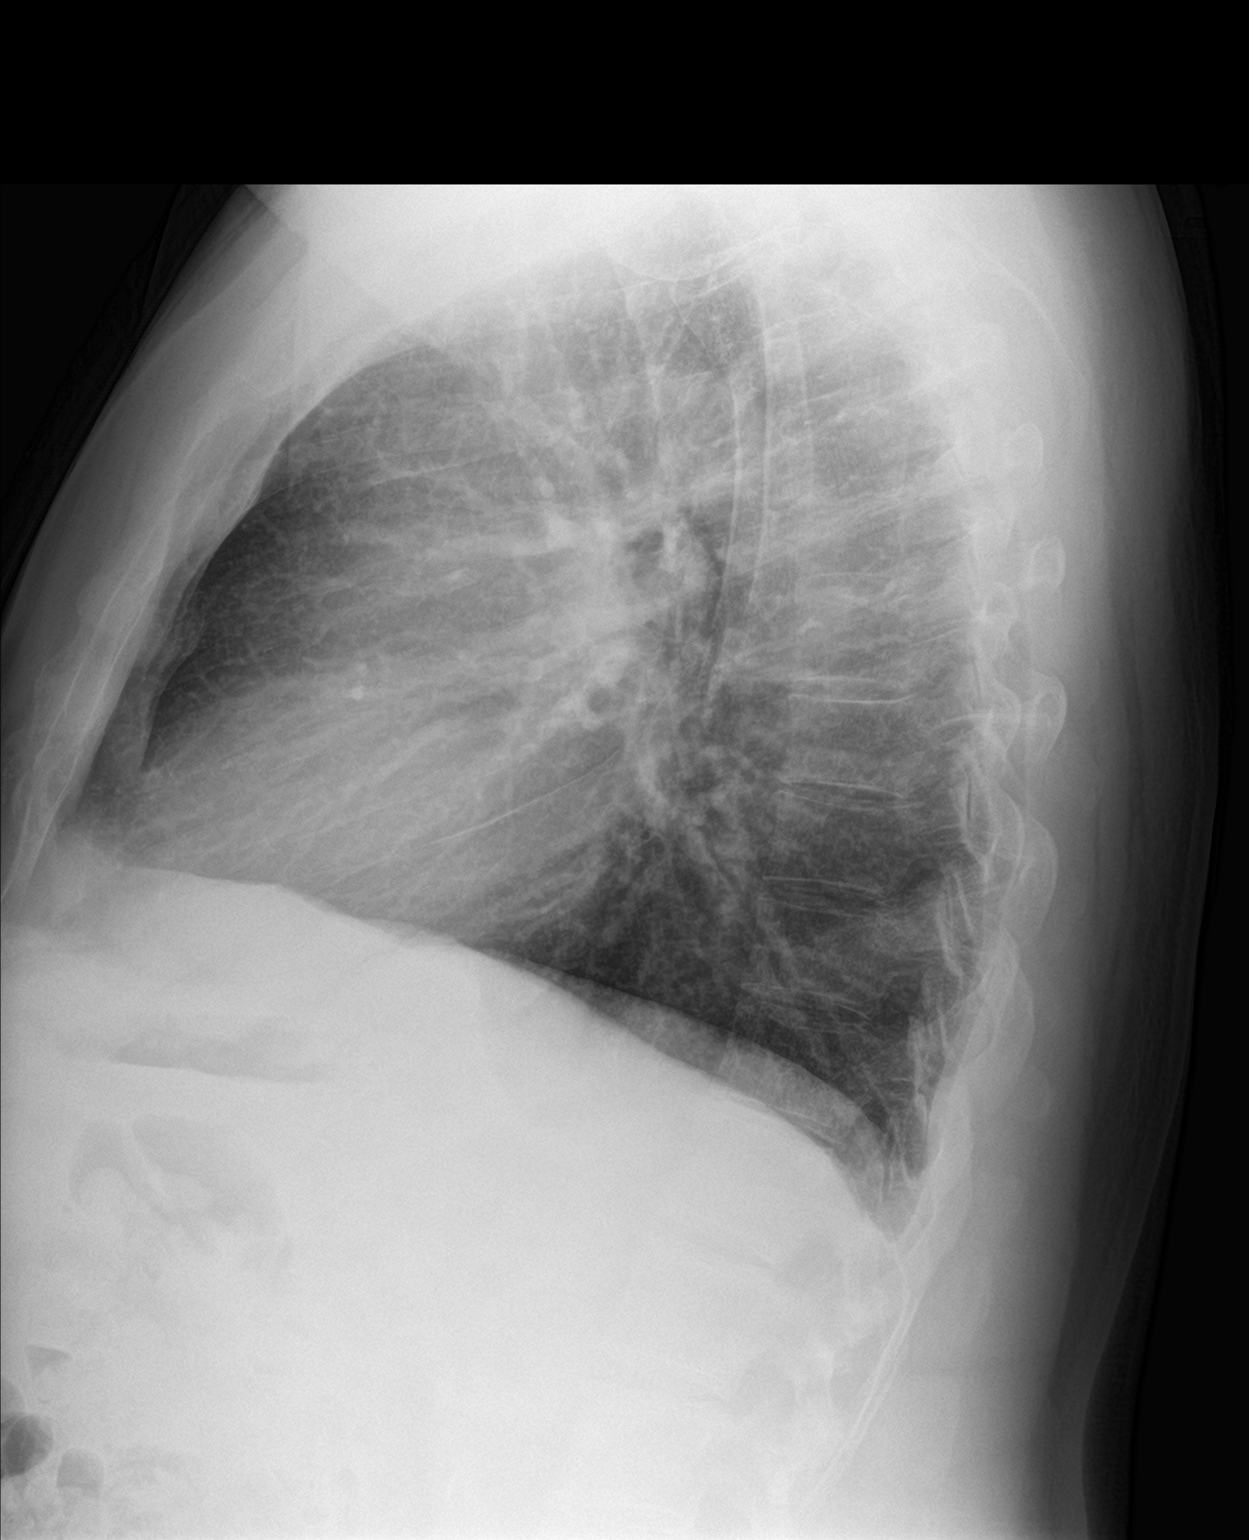

[2 of 2 positions shown; findings below may reference images not displayed]

FINDINGS: The lungs are borderline hyperinflated with hemidiaphragm
flattening. The interstitial markings are mildly increased
diffusely. There is no alveolar infiltrate or pleural effusion. No
pulmonary parenchymal masses or nodules are observed. The heart and
pulmonary vascularity are normal. The mediastinum is normal in
width. The bony thorax exhibits no acute abnormality.
IMPRESSION: Mild chronic bronchitic and smoking related changes. There is no
acute cardiopulmonary abnormality.

## 2017-09-23 DIAGNOSIS — B029 Zoster without complications: Secondary | ICD-10-CM | POA: Diagnosis not present

## 2017-10-15 DIAGNOSIS — J340 Abscess, furuncle and carbuncle of nose: Secondary | ICD-10-CM | POA: Diagnosis not present

## 2017-10-15 DIAGNOSIS — L0390 Cellulitis, unspecified: Secondary | ICD-10-CM | POA: Diagnosis not present

## 2017-10-26 DIAGNOSIS — H04123 Dry eye syndrome of bilateral lacrimal glands: Secondary | ICD-10-CM | POA: Diagnosis not present

## 2017-11-09 DIAGNOSIS — Z86010 Personal history of colonic polyps: Secondary | ICD-10-CM

## 2017-11-09 DIAGNOSIS — Z860101 Personal history of adenomatous and serrated colon polyps: Secondary | ICD-10-CM

## 2017-11-09 HISTORY — DX: Personal history of colonic polyps: Z86.010

## 2017-11-09 HISTORY — DX: Personal history of adenomatous and serrated colon polyps: Z86.0101

## 2017-12-02 DIAGNOSIS — Z01818 Encounter for other preprocedural examination: Secondary | ICD-10-CM | POA: Diagnosis not present

## 2017-12-02 DIAGNOSIS — R195 Other fecal abnormalities: Secondary | ICD-10-CM | POA: Diagnosis not present

## 2017-12-02 DIAGNOSIS — D500 Iron deficiency anemia secondary to blood loss (chronic): Secondary | ICD-10-CM | POA: Diagnosis not present

## 2017-12-07 DIAGNOSIS — R1310 Dysphagia, unspecified: Secondary | ICD-10-CM | POA: Diagnosis not present

## 2017-12-07 DIAGNOSIS — I10 Essential (primary) hypertension: Secondary | ICD-10-CM | POA: Diagnosis not present

## 2017-12-07 DIAGNOSIS — D122 Benign neoplasm of ascending colon: Secondary | ICD-10-CM | POA: Diagnosis not present

## 2017-12-07 DIAGNOSIS — K921 Melena: Secondary | ICD-10-CM | POA: Diagnosis not present

## 2017-12-07 DIAGNOSIS — K449 Diaphragmatic hernia without obstruction or gangrene: Secondary | ICD-10-CM | POA: Diagnosis not present

## 2017-12-07 DIAGNOSIS — Z8711 Personal history of peptic ulcer disease: Secondary | ICD-10-CM | POA: Diagnosis not present

## 2017-12-07 DIAGNOSIS — D126 Benign neoplasm of colon, unspecified: Secondary | ICD-10-CM | POA: Diagnosis not present

## 2017-12-07 DIAGNOSIS — K222 Esophageal obstruction: Secondary | ICD-10-CM | POA: Diagnosis not present

## 2017-12-07 DIAGNOSIS — K644 Residual hemorrhoidal skin tags: Secondary | ICD-10-CM | POA: Diagnosis not present

## 2017-12-07 DIAGNOSIS — Z86010 Personal history of colonic polyps: Secondary | ICD-10-CM | POA: Diagnosis not present

## 2017-12-07 DIAGNOSIS — Z1211 Encounter for screening for malignant neoplasm of colon: Secondary | ICD-10-CM | POA: Diagnosis not present

## 2017-12-07 DIAGNOSIS — Z79899 Other long term (current) drug therapy: Secondary | ICD-10-CM | POA: Diagnosis not present

## 2017-12-07 DIAGNOSIS — K5730 Diverticulosis of large intestine without perforation or abscess without bleeding: Secondary | ICD-10-CM | POA: Diagnosis not present

## 2018-02-18 DIAGNOSIS — H16042 Marginal corneal ulcer, left eye: Secondary | ICD-10-CM | POA: Diagnosis not present

## 2018-02-23 DIAGNOSIS — H16042 Marginal corneal ulcer, left eye: Secondary | ICD-10-CM | POA: Diagnosis not present

## 2018-03-11 ENCOUNTER — Telehealth: Payer: Self-pay | Admitting: Cardiovascular Disease

## 2018-03-11 NOTE — Telephone Encounter (Signed)
New Message  Pt c/o medication issue:  1. Name of Medication: losartan-hydrochlorothiazide (HYZAAR) 100-12.5 MG tablet  2. How are you currently taking this medication (dosage and times per day)? Take 1 tablet by mouth daily.  3. Are you having a reaction (difficulty breathing--STAT)? no  4. What is your medication issue? Pt states he heard about a recall on the medication  and wants to know what he should do as far as stopping the medication

## 2018-03-11 NOTE — Telephone Encounter (Signed)
Informed patient to contact his pharmacy to check with them to make sure his medication has not been recalled. Patient stated he would call them and find out. Informed patient that most of the time the pharmacy will contact the patient if their medications have been recall, and they will contact our office as well for an alternative medications. Patient agreed to plan and will call his pharmacy.

## 2018-05-16 ENCOUNTER — Encounter: Payer: Self-pay | Admitting: Cardiovascular Disease

## 2018-05-28 DIAGNOSIS — B009 Herpesviral infection, unspecified: Secondary | ICD-10-CM | POA: Diagnosis not present

## 2018-05-28 DIAGNOSIS — S61201A Unspecified open wound of left index finger without damage to nail, initial encounter: Secondary | ICD-10-CM | POA: Diagnosis not present

## 2018-06-09 ENCOUNTER — Ambulatory Visit: Payer: BLUE CROSS/BLUE SHIELD | Admitting: Cardiovascular Disease

## 2018-06-13 DIAGNOSIS — M19032 Primary osteoarthritis, left wrist: Secondary | ICD-10-CM | POA: Diagnosis not present

## 2018-06-18 DIAGNOSIS — J069 Acute upper respiratory infection, unspecified: Secondary | ICD-10-CM | POA: Diagnosis not present

## 2018-06-18 DIAGNOSIS — J0100 Acute maxillary sinusitis, unspecified: Secondary | ICD-10-CM | POA: Diagnosis not present

## 2018-07-01 DIAGNOSIS — M24042 Loose body in left finger joint(s): Secondary | ICD-10-CM | POA: Diagnosis not present

## 2018-07-01 DIAGNOSIS — M6588 Other synovitis and tenosynovitis, other site: Secondary | ICD-10-CM | POA: Diagnosis not present

## 2018-07-01 DIAGNOSIS — M19042 Primary osteoarthritis, left hand: Secondary | ICD-10-CM | POA: Diagnosis not present

## 2018-07-01 DIAGNOSIS — M2408 Loose body, other site: Secondary | ICD-10-CM | POA: Diagnosis not present

## 2018-07-01 DIAGNOSIS — G8918 Other acute postprocedural pain: Secondary | ICD-10-CM | POA: Diagnosis not present

## 2018-07-01 DIAGNOSIS — M1812 Unilateral primary osteoarthritis of first carpometacarpal joint, left hand: Secondary | ICD-10-CM | POA: Diagnosis not present

## 2018-07-11 ENCOUNTER — Other Ambulatory Visit: Payer: Self-pay | Admitting: Cardiovascular Disease

## 2018-07-12 ENCOUNTER — Other Ambulatory Visit: Payer: Self-pay | Admitting: Cardiovascular Disease

## 2018-07-14 DIAGNOSIS — N5201 Erectile dysfunction due to arterial insufficiency: Secondary | ICD-10-CM | POA: Diagnosis not present

## 2018-07-14 DIAGNOSIS — N401 Enlarged prostate with lower urinary tract symptoms: Secondary | ICD-10-CM | POA: Diagnosis not present

## 2018-07-14 DIAGNOSIS — R351 Nocturia: Secondary | ICD-10-CM | POA: Diagnosis not present

## 2018-07-20 ENCOUNTER — Telehealth: Payer: Self-pay

## 2018-07-20 NOTE — Telephone Encounter (Signed)
Received fax from [facility] stating - Patient requests new RX: Losartan HCTZ on Backorder please separate or change medication. Will forward to pharmD to see if order can be separated to Losartan 100 mg and HCTZ 12.5 mg.

## 2018-07-20 NOTE — Telephone Encounter (Signed)
Yes these can be separated to losartan 100mg  daily and HCTZ 12.5mg  daily.

## 2018-07-21 MED ORDER — HYDROCHLOROTHIAZIDE 12.5 MG PO CAPS: 12.5000 mg | ORAL_CAPSULE | Freq: Every day | ORAL | 3 refills | Status: DC

## 2018-07-21 MED ORDER — LOSARTAN POTASSIUM 100 MG PO TABS: 100.0000 mg | ORAL_TABLET | Freq: Every day | ORAL | 3 refills | Status: DC

## 2018-07-21 NOTE — Telephone Encounter (Signed)
Called patient about change in medications. Patient is aware that medication is going to be separated to losartan 100 mg and HCTZ 12.5 mg. Patient verbalized understanding.

## 2018-07-25 DIAGNOSIS — M25642 Stiffness of left hand, not elsewhere classified: Secondary | ICD-10-CM | POA: Diagnosis not present

## 2018-07-25 DIAGNOSIS — M79645 Pain in left finger(s): Secondary | ICD-10-CM | POA: Diagnosis not present

## 2018-07-25 DIAGNOSIS — M25442 Effusion, left hand: Secondary | ICD-10-CM | POA: Diagnosis not present

## 2018-07-25 DIAGNOSIS — M180 Bilateral primary osteoarthritis of first carpometacarpal joints: Secondary | ICD-10-CM | POA: Diagnosis not present

## 2018-07-28 DIAGNOSIS — M79645 Pain in left finger(s): Secondary | ICD-10-CM | POA: Diagnosis not present

## 2018-07-28 DIAGNOSIS — M180 Bilateral primary osteoarthritis of first carpometacarpal joints: Secondary | ICD-10-CM | POA: Diagnosis not present

## 2018-07-28 DIAGNOSIS — M25442 Effusion, left hand: Secondary | ICD-10-CM | POA: Diagnosis not present

## 2018-07-28 DIAGNOSIS — M25642 Stiffness of left hand, not elsewhere classified: Secondary | ICD-10-CM | POA: Diagnosis not present

## 2018-08-02 DIAGNOSIS — M79645 Pain in left finger(s): Secondary | ICD-10-CM | POA: Diagnosis not present

## 2018-08-02 DIAGNOSIS — M25642 Stiffness of left hand, not elsewhere classified: Secondary | ICD-10-CM | POA: Diagnosis not present

## 2018-08-02 DIAGNOSIS — M180 Bilateral primary osteoarthritis of first carpometacarpal joints: Secondary | ICD-10-CM | POA: Diagnosis not present

## 2018-08-02 DIAGNOSIS — M25442 Effusion, left hand: Secondary | ICD-10-CM | POA: Diagnosis not present

## 2018-08-04 DIAGNOSIS — M25642 Stiffness of left hand, not elsewhere classified: Secondary | ICD-10-CM | POA: Diagnosis not present

## 2018-08-04 DIAGNOSIS — M180 Bilateral primary osteoarthritis of first carpometacarpal joints: Secondary | ICD-10-CM | POA: Diagnosis not present

## 2018-08-04 DIAGNOSIS — M25442 Effusion, left hand: Secondary | ICD-10-CM | POA: Diagnosis not present

## 2018-08-04 DIAGNOSIS — M79645 Pain in left finger(s): Secondary | ICD-10-CM | POA: Diagnosis not present

## 2018-08-08 DIAGNOSIS — M180 Bilateral primary osteoarthritis of first carpometacarpal joints: Secondary | ICD-10-CM | POA: Diagnosis not present

## 2018-08-08 DIAGNOSIS — M79645 Pain in left finger(s): Secondary | ICD-10-CM | POA: Diagnosis not present

## 2018-08-08 DIAGNOSIS — M25442 Effusion, left hand: Secondary | ICD-10-CM | POA: Diagnosis not present

## 2018-08-08 DIAGNOSIS — M25642 Stiffness of left hand, not elsewhere classified: Secondary | ICD-10-CM | POA: Diagnosis not present

## 2018-08-11 DIAGNOSIS — M180 Bilateral primary osteoarthritis of first carpometacarpal joints: Secondary | ICD-10-CM | POA: Diagnosis not present

## 2018-08-11 DIAGNOSIS — M25642 Stiffness of left hand, not elsewhere classified: Secondary | ICD-10-CM | POA: Diagnosis not present

## 2018-08-11 DIAGNOSIS — M25442 Effusion, left hand: Secondary | ICD-10-CM | POA: Diagnosis not present

## 2018-08-11 DIAGNOSIS — M79645 Pain in left finger(s): Secondary | ICD-10-CM | POA: Diagnosis not present

## 2018-08-15 DIAGNOSIS — J189 Pneumonia, unspecified organism: Secondary | ICD-10-CM | POA: Diagnosis not present

## 2018-08-15 DIAGNOSIS — R509 Fever, unspecified: Secondary | ICD-10-CM | POA: Diagnosis not present

## 2018-08-15 DIAGNOSIS — R1084 Generalized abdominal pain: Secondary | ICD-10-CM | POA: Diagnosis not present

## 2018-08-15 DIAGNOSIS — R5383 Other fatigue: Secondary | ICD-10-CM | POA: Diagnosis not present

## 2018-08-16 DIAGNOSIS — M79645 Pain in left finger(s): Secondary | ICD-10-CM | POA: Diagnosis not present

## 2018-08-16 DIAGNOSIS — M25642 Stiffness of left hand, not elsewhere classified: Secondary | ICD-10-CM | POA: Diagnosis not present

## 2018-08-16 DIAGNOSIS — M180 Bilateral primary osteoarthritis of first carpometacarpal joints: Secondary | ICD-10-CM | POA: Diagnosis not present

## 2018-08-16 DIAGNOSIS — M25442 Effusion, left hand: Secondary | ICD-10-CM | POA: Diagnosis not present

## 2018-08-18 DIAGNOSIS — M180 Bilateral primary osteoarthritis of first carpometacarpal joints: Secondary | ICD-10-CM | POA: Diagnosis not present

## 2018-08-18 DIAGNOSIS — M79645 Pain in left finger(s): Secondary | ICD-10-CM | POA: Diagnosis not present

## 2018-08-18 DIAGNOSIS — M25642 Stiffness of left hand, not elsewhere classified: Secondary | ICD-10-CM | POA: Diagnosis not present

## 2018-08-18 DIAGNOSIS — M25442 Effusion, left hand: Secondary | ICD-10-CM | POA: Diagnosis not present

## 2018-08-23 DIAGNOSIS — M25442 Effusion, left hand: Secondary | ICD-10-CM | POA: Diagnosis not present

## 2018-08-23 DIAGNOSIS — M79645 Pain in left finger(s): Secondary | ICD-10-CM | POA: Diagnosis not present

## 2018-08-23 DIAGNOSIS — M25642 Stiffness of left hand, not elsewhere classified: Secondary | ICD-10-CM | POA: Diagnosis not present

## 2018-08-23 DIAGNOSIS — M180 Bilateral primary osteoarthritis of first carpometacarpal joints: Secondary | ICD-10-CM | POA: Diagnosis not present

## 2018-08-25 DIAGNOSIS — R1011 Right upper quadrant pain: Secondary | ICD-10-CM | POA: Diagnosis not present

## 2018-08-26 DIAGNOSIS — M180 Bilateral primary osteoarthritis of first carpometacarpal joints: Secondary | ICD-10-CM | POA: Diagnosis not present

## 2018-08-26 DIAGNOSIS — M25642 Stiffness of left hand, not elsewhere classified: Secondary | ICD-10-CM | POA: Diagnosis not present

## 2018-08-26 DIAGNOSIS — M25442 Effusion, left hand: Secondary | ICD-10-CM | POA: Diagnosis not present

## 2018-08-26 DIAGNOSIS — M79645 Pain in left finger(s): Secondary | ICD-10-CM | POA: Diagnosis not present

## 2018-08-30 DIAGNOSIS — M180 Bilateral primary osteoarthritis of first carpometacarpal joints: Secondary | ICD-10-CM | POA: Diagnosis not present

## 2018-08-30 DIAGNOSIS — M79645 Pain in left finger(s): Secondary | ICD-10-CM | POA: Diagnosis not present

## 2018-08-30 DIAGNOSIS — M25442 Effusion, left hand: Secondary | ICD-10-CM | POA: Diagnosis not present

## 2018-08-30 DIAGNOSIS — M25642 Stiffness of left hand, not elsewhere classified: Secondary | ICD-10-CM | POA: Diagnosis not present

## 2018-09-01 DIAGNOSIS — K8020 Calculus of gallbladder without cholecystitis without obstruction: Secondary | ICD-10-CM | POA: Diagnosis not present

## 2018-09-01 DIAGNOSIS — M180 Bilateral primary osteoarthritis of first carpometacarpal joints: Secondary | ICD-10-CM | POA: Diagnosis not present

## 2018-09-01 DIAGNOSIS — M25442 Effusion, left hand: Secondary | ICD-10-CM | POA: Diagnosis not present

## 2018-09-01 DIAGNOSIS — R1011 Right upper quadrant pain: Secondary | ICD-10-CM | POA: Diagnosis not present

## 2018-09-01 DIAGNOSIS — M25642 Stiffness of left hand, not elsewhere classified: Secondary | ICD-10-CM | POA: Diagnosis not present

## 2018-09-01 DIAGNOSIS — M79645 Pain in left finger(s): Secondary | ICD-10-CM | POA: Diagnosis not present

## 2018-09-01 NOTE — Progress Notes (Deleted)
Patient ID: Barry Mcdonald, male   DOB: [birth year], 67 y.o.   MRN: [id number] 66 y.o.  had atypical chest pain in the past with a normal cath 2013 . He has significant hypertension.  Was on amlodipine that was ineffective. Now on Cozaar.  Lasix and HCTZ . Blood pressure seems to be under reasonable control. Edema better on diuretics . He does not have any significant chest pain. He quit using smokeless tobacco 4 years ago..   Cath in 2013 reviewed and no significant CAD   Was on proscar for prostate but has not f/u with Grapey Bothered by ventral hernia had him see Rosenbower 04/2014 and he agreed no surgery needed   Has a  girlfriend that has him exercising using fit bit and eating low Carb Very attentive to calories burnt and intake  Takes his Lasix  as needed for edema. Going to beach and [place] a lot    ROS: Denies fever, malais, weight loss, blurry vision, decreased visual acuity, cough, sputum, SOB, hemoptysis, pleuritic pain, palpitaitons, heartburn, abdominal pain, melena, lower extremity edema, claudication, or rash.  All other systems reviewed and negative  General: There were no vitals taken for this visit. Affect appropriate Overweight white male  HEENT: normal Neck supple with no adenopathy JVP normal no bruits no thyromegaly Lungs clear with no wheezing and good diaphragmatic motion Heart:  S1/S2 no murmur, no rub, gallop or click PMI normal Abdomen: benighn, BS positve, no tenderness, no AAA ventral hernia  no bruit.  No HSM or HJR Distal pulses intact with no bruits Plus one edema Neuro non-focal Skin warm and dry No muscular weakness    Current Outpatient Medications  Medication Sig Dispense Refill  . alfuzosin (UROXATRAL) 10 MG 24 hr tablet Take 10 mg by mouth daily.    . furosemide (LASIX) 20 MG tablet Take 1 tablet (20 mg total) by mouth daily. 90 tablet 3  . hydrochlorothiazide (MICROZIDE) 12.5 MG capsule Take 1 capsule (12.5 mg total) by mouth daily.  90 capsule 3  . losartan (COZAAR) 100 MG tablet Take 1 tablet (100 mg total) by mouth daily. 90 tablet 3  . pantoprazole (PROTONIX) 40 MG tablet TAKE 1 TABLET BY MOUTH EVERY DAY 90 tablet 0   No current facility-administered medications for this visit.     Allergies  Patient has no known allergies.  Electrocardiogram:  03/15/14  SR rate 64  Normal ECG  04/29/15  SR rate 67  Normal ECG  05/17/17  SR rate 55 normal   Assessment and Plan HTN:  Well controlled.  Continue current medications and low sodium Dash type diet.   Edema:  Low salt diet continue diuretic ARB and diuretic separated due to back order at pharmacy    GERD:  Continue proton pump inhibiter and low carb diet GI:  Rectus diastasis stable no need for surgery better with weight loss  [name]

## 2018-09-02 DIAGNOSIS — K8000 Calculus of gallbladder with acute cholecystitis without obstruction: Secondary | ICD-10-CM | POA: Diagnosis not present

## 2018-09-02 DIAGNOSIS — K8050 Calculus of bile duct without cholangitis or cholecystitis without obstruction: Secondary | ICD-10-CM | POA: Diagnosis not present

## 2018-09-02 DIAGNOSIS — R1011 Right upper quadrant pain: Secondary | ICD-10-CM | POA: Diagnosis not present

## 2018-09-05 DIAGNOSIS — K8050 Calculus of bile duct without cholangitis or cholecystitis without obstruction: Secondary | ICD-10-CM | POA: Diagnosis not present

## 2018-09-06 DIAGNOSIS — Z79899 Other long term (current) drug therapy: Secondary | ICD-10-CM | POA: Diagnosis not present

## 2018-09-06 DIAGNOSIS — Z87891 Personal history of nicotine dependence: Secondary | ICD-10-CM | POA: Diagnosis not present

## 2018-09-06 DIAGNOSIS — K8020 Calculus of gallbladder without cholecystitis without obstruction: Secondary | ICD-10-CM | POA: Diagnosis not present

## 2018-09-06 DIAGNOSIS — Z8711 Personal history of peptic ulcer disease: Secondary | ICD-10-CM | POA: Diagnosis not present

## 2018-09-06 DIAGNOSIS — Z22322 Carrier or suspected carrier of Methicillin resistant Staphylococcus aureus: Secondary | ICD-10-CM | POA: Diagnosis not present

## 2018-09-06 DIAGNOSIS — K8050 Calculus of bile duct without cholangitis or cholecystitis without obstruction: Secondary | ICD-10-CM | POA: Diagnosis not present

## 2018-09-06 DIAGNOSIS — I10 Essential (primary) hypertension: Secondary | ICD-10-CM | POA: Diagnosis not present

## 2018-09-06 DIAGNOSIS — K828 Other specified diseases of gallbladder: Secondary | ICD-10-CM | POA: Diagnosis not present

## 2018-09-06 DIAGNOSIS — M19041 Primary osteoarthritis, right hand: Secondary | ICD-10-CM | POA: Diagnosis not present

## 2018-09-06 DIAGNOSIS — I2510 Atherosclerotic heart disease of native coronary artery without angina pectoris: Secondary | ICD-10-CM | POA: Diagnosis not present

## 2018-09-06 DIAGNOSIS — K8010 Calculus of gallbladder with chronic cholecystitis without obstruction: Secondary | ICD-10-CM | POA: Diagnosis not present

## 2018-09-06 DIAGNOSIS — M19042 Primary osteoarthritis, left hand: Secondary | ICD-10-CM | POA: Diagnosis not present

## 2018-09-07 ENCOUNTER — Ambulatory Visit: Payer: BLUE CROSS/BLUE SHIELD | Admitting: Cardiovascular Disease

## 2018-09-07 DIAGNOSIS — K8010 Calculus of gallbladder with chronic cholecystitis without obstruction: Secondary | ICD-10-CM | POA: Diagnosis not present

## 2018-09-07 DIAGNOSIS — K8050 Calculus of bile duct without cholangitis or cholecystitis without obstruction: Secondary | ICD-10-CM | POA: Diagnosis not present

## 2018-09-07 DIAGNOSIS — I10 Essential (primary) hypertension: Secondary | ICD-10-CM | POA: Diagnosis not present

## 2018-09-07 DIAGNOSIS — M19041 Primary osteoarthritis, right hand: Secondary | ICD-10-CM | POA: Diagnosis not present

## 2018-09-07 DIAGNOSIS — K8020 Calculus of gallbladder without cholecystitis without obstruction: Secondary | ICD-10-CM | POA: Diagnosis not present

## 2018-09-07 DIAGNOSIS — M19042 Primary osteoarthritis, left hand: Secondary | ICD-10-CM | POA: Diagnosis not present

## 2018-09-07 DIAGNOSIS — K810 Acute cholecystitis: Secondary | ICD-10-CM | POA: Diagnosis not present

## 2018-09-07 DIAGNOSIS — Z22322 Carrier or suspected carrier of Methicillin resistant Staphylococcus aureus: Secondary | ICD-10-CM | POA: Diagnosis not present

## 2018-09-08 DIAGNOSIS — Z22322 Carrier or suspected carrier of Methicillin resistant Staphylococcus aureus: Secondary | ICD-10-CM | POA: Diagnosis not present

## 2018-09-08 DIAGNOSIS — I10 Essential (primary) hypertension: Secondary | ICD-10-CM | POA: Diagnosis not present

## 2018-09-08 DIAGNOSIS — M19041 Primary osteoarthritis, right hand: Secondary | ICD-10-CM | POA: Diagnosis not present

## 2018-09-08 DIAGNOSIS — K8010 Calculus of gallbladder with chronic cholecystitis without obstruction: Secondary | ICD-10-CM | POA: Diagnosis not present

## 2018-09-08 DIAGNOSIS — M19042 Primary osteoarthritis, left hand: Secondary | ICD-10-CM | POA: Diagnosis not present

## 2018-09-08 DIAGNOSIS — K8050 Calculus of bile duct without cholangitis or cholecystitis without obstruction: Secondary | ICD-10-CM | POA: Diagnosis not present

## 2018-09-13 DIAGNOSIS — Z09 Encounter for follow-up examination after completed treatment for conditions other than malignant neoplasm: Secondary | ICD-10-CM | POA: Diagnosis not present

## 2018-09-14 DIAGNOSIS — R200 Anesthesia of skin: Secondary | ICD-10-CM | POA: Diagnosis not present

## 2018-09-14 DIAGNOSIS — S61211A Laceration without foreign body of left index finger without damage to nail, initial encounter: Secondary | ICD-10-CM | POA: Diagnosis not present

## 2018-09-21 DIAGNOSIS — M79645 Pain in left finger(s): Secondary | ICD-10-CM | POA: Diagnosis not present

## 2018-09-21 DIAGNOSIS — M25642 Stiffness of left hand, not elsewhere classified: Secondary | ICD-10-CM | POA: Diagnosis not present

## 2018-09-21 DIAGNOSIS — M25442 Effusion, left hand: Secondary | ICD-10-CM | POA: Diagnosis not present

## 2018-09-21 DIAGNOSIS — M180 Bilateral primary osteoarthritis of first carpometacarpal joints: Secondary | ICD-10-CM | POA: Diagnosis not present

## 2018-09-26 DIAGNOSIS — A4902 Methicillin resistant Staphylococcus aureus infection, unspecified site: Secondary | ICD-10-CM | POA: Diagnosis not present

## 2018-10-04 ENCOUNTER — Other Ambulatory Visit: Payer: Self-pay | Admitting: Cardiovascular Disease

## 2018-10-11 ENCOUNTER — Other Ambulatory Visit: Payer: Self-pay | Admitting: Cardiovascular Disease

## 2018-10-11 NOTE — Telephone Encounter (Signed)
Outpatient Medication Detail    Disp Refills Start End   pantoprazole (PROTONIX) 40 MG tablet 30 tablet 0 10/04/2018    Sig: TAKE 1 TABLET BY MOUTH EVERY DAY   Sent to pharmacy as: pantoprazole (PROTONIX) 40 MG tablet   Notes to Pharmacy: Pt must keep upcoming appt for future refills. Thank you.   E-Prescribing Status: Receipt confirmed by pharmacy (10/04/2018 8:57 AM EST)   Pharmacy   [id number] - [place], [place]. [address]

## 2018-10-26 NOTE — Progress Notes (Signed)
Patient ID: Barry Mcdonald, male   DOB: 28-Apr-1951, 67 y.o.   MRN: [id number]     66 y.o. f/u HTN and atypical chest pain. Cath 2013 normal cors. Norvasc not effective for BP On diuretic for LE edema.    Hasn't worked out much last 5 months  [employer] girlfriend Rx breast cancer He had his GB out in October   Going to beach and [place] a lot   No chest pain   ROS: Denies fever, malais, weight loss, blurry vision, decreased visual acuity, cough, sputum, SOB, hemoptysis, pleuritic pain, palpitaitons, heartburn, abdominal pain, melena, lower extremity edema, claudication, or rash.  All other systems reviewed and negative  General: BP 126/64   Pulse 71   Ht 5' 7.5" (1.715 m)   Wt 215 lb (97.5 kg)   BMI 33.18 kg/m  Affect appropriate Overweight white male  HEENT: normal Neck supple with no adenopathy JVP normal no bruits no thyromegaly Lungs clear with no wheezing and good diaphragmatic motion Heart:  S1/S2 no murmur, no rub, gallop or click PMI normal Abdomen: benighn, BS positve, no tenderness, no AAA ventral hernia  no bruit.  No HSM or HJR Distal pulses intact with no bruits Plus one edema Neuro non-focal Skin warm and dry No muscular weakness    Current Outpatient Medications  Medication Sig Dispense Refill  . alfuzosin (UROXATRAL) 10 MG 24 hr tablet Take 10 mg by mouth daily.    . furosemide (LASIX) 20 MG tablet Take 1 tablet (20 mg total) by mouth daily. 90 tablet 3  . hydrochlorothiazide (MICROZIDE) 12.5 MG capsule Take 1 capsule (12.5 mg total) by mouth daily. 90 capsule 3  . losartan (COZAAR) 100 MG tablet Take 1 tablet (100 mg total) by mouth daily. 90 tablet 3  . pantoprazole (PROTONIX) 40 MG tablet TAKE 1 TABLET BY MOUTH EVERY DAY 30 tablet 0   No current facility-administered medications for this visit.     Allergies  Patient has no known allergies.  Electrocardiogram:  10/28/18 NSR normal ECG   Assessment and Plan HTN:  Well controlled.  Continue  current medications and low sodium Dash type diet.   Edema: improved with diuretic dependant  GERD:  Continue proton pump inhibiter and low carb diet GI:  Rectus diastasis stable no need for surgery better with weight loss Post GB removal with no biliary cholic  [name]

## 2018-10-28 ENCOUNTER — Encounter (INDEPENDENT_AMBULATORY_CARE_PROVIDER_SITE_OTHER): Payer: Self-pay

## 2018-10-28 ENCOUNTER — Encounter: Payer: Self-pay | Admitting: Cardiovascular Disease

## 2018-10-28 ENCOUNTER — Ambulatory Visit (INDEPENDENT_AMBULATORY_CARE_PROVIDER_SITE_OTHER): Payer: Medicare Other | Admitting: Cardiovascular Disease

## 2018-10-28 VITALS — BP 126/64 | HR 71 | Ht 67.5 in | Wt 215.0 lb

## 2018-10-28 DIAGNOSIS — I10 Essential (primary) hypertension: Secondary | ICD-10-CM | POA: Diagnosis not present

## 2018-10-28 MED ORDER — FUROSEMIDE 20 MG PO TABS: 20.0000 mg | ORAL_TABLET | Freq: Every day | ORAL | 3 refills | Status: DC

## 2018-10-28 MED ORDER — HYDROCHLOROTHIAZIDE 12.5 MG PO CAPS: 12.5000 mg | ORAL_CAPSULE | Freq: Every day | ORAL | 3 refills | Status: DC

## 2018-10-28 MED ORDER — PANTOPRAZOLE SODIUM 40 MG PO TBEC: 40.0000 mg | DELAYED_RELEASE_TABLET | Freq: Every day | ORAL | 3 refills | Status: DC

## 2018-10-28 MED ORDER — LOSARTAN POTASSIUM 100 MG PO TABS: 100.0000 mg | ORAL_TABLET | Freq: Every day | ORAL | 3 refills | Status: DC

## 2018-10-28 NOTE — Patient Instructions (Addendum)

## 2019-01-09 DIAGNOSIS — M722 Plantar fascial fibromatosis: Secondary | ICD-10-CM | POA: Diagnosis not present

## 2019-01-29 DIAGNOSIS — A4902 Methicillin resistant Staphylococcus aureus infection, unspecified site: Secondary | ICD-10-CM | POA: Diagnosis not present

## 2019-05-01 ENCOUNTER — Other Ambulatory Visit: Payer: Self-pay | Admitting: Cardiovascular Disease

## 2019-07-10 ENCOUNTER — Other Ambulatory Visit: Payer: Self-pay | Admitting: Cardiovascular Disease

## 2019-08-03 DIAGNOSIS — M25512 Pain in left shoulder: Secondary | ICD-10-CM | POA: Diagnosis not present

## 2019-08-03 DIAGNOSIS — N5201 Erectile dysfunction due to arterial insufficiency: Secondary | ICD-10-CM | POA: Diagnosis not present

## 2019-08-03 DIAGNOSIS — R351 Nocturia: Secondary | ICD-10-CM | POA: Diagnosis not present

## 2019-08-03 DIAGNOSIS — N401 Enlarged prostate with lower urinary tract symptoms: Secondary | ICD-10-CM | POA: Diagnosis not present

## 2019-08-25 NOTE — Progress Notes (Signed)
Patient ID: Barry Mcdonald, male   DOB: 1951/10/06, 68 y.o.   MRN: [id number]     68 y.o. f/u HTN and atypical chest pain. Cath 2013 normal cors. Norvasc not effective for BP On diuretic for LE edema.    More sedentary during Barry Mcdonald girlfriend Rx breast cancer He had his GB out a year ago   Enjoys going to beach and [place] a lot   No chest pain   Wfie Barry Mcdonald had breast cancer Rx with lumpectomy / XRT end of last year Having trouble getting Losartan   ROS: Denies fever, malais, weight loss, blurry vision, decreased visual acuity, cough, sputum, SOB, hemoptysis, pleuritic pain, palpitaitons, heartburn, abdominal pain, melena, lower extremity edema, claudication, or rash.  All other systems reviewed and negative  General: BP 126/88   Pulse 78   Ht 5\' 8"  (1.727 m)   Wt 220 lb (99.8 kg)   SpO2 98%   BMI 33.45 kg/m    Affect appropriate Overweight white male  HEENT: normal Neck supple with no adenopathy JVP normal no bruits no thyromegaly Lungs clear with no wheezing and good diaphragmatic motion Heart:  S1/S2 no murmur, no rub, gallop or click PMI normal Abdomen: benighn, BS positve, no tenderness, no AAA ventral hernia  no bruit.  No HSM or HJR Distal pulses intact with no bruits Plus one edema Neuro non-focal Skin warm and dry No muscular weakness    Current Outpatient Medications  Medication Sig Dispense Refill  . alfuzosin (UROXATRAL) 10 MG 24 hr tablet Take 10 mg by mouth daily.    . furosemide (LASIX) 20 MG tablet Take 1 tablet (20 mg total) by mouth daily. 90 tablet 3  . hydrochlorothiazide (MICROZIDE) 12.5 MG capsule Take 1 capsule (12.5 mg total) by mouth daily. 90 capsule 3  . pantoprazole (PROTONIX) 40 MG tablet Take 1 tablet (40 mg total) by mouth daily. 90 tablet 3  . valsartan (DIOVAN) 80 MG tablet TAKE 1 TABLET BY MOUTH EVERY DAY 90 tablet 0   No current facility-administered medications for this visit.     Allergies  Patient has no  known allergies.  Electrocardiogram:  10/28/18 NSR normal ECG   Assessment and Plan HTN:  Valsartan not as effective will try to see if [facility] has Cozaar and switch back Edema: improved with diuretic dependant  GERD:  Continue proton pump inhibiter and low carb diet GI:  Rectus diastasis stable no need for surgery better with weight loss Post GB removal with no biliary cholic   Barry Mcdonald

## 2019-08-28 ENCOUNTER — Other Ambulatory Visit: Payer: Self-pay

## 2019-08-28 ENCOUNTER — Encounter (INDEPENDENT_AMBULATORY_CARE_PROVIDER_SITE_OTHER): Payer: Self-pay

## 2019-08-28 ENCOUNTER — Encounter: Payer: Self-pay | Admitting: Cardiovascular Disease

## 2019-08-28 ENCOUNTER — Ambulatory Visit (INDEPENDENT_AMBULATORY_CARE_PROVIDER_SITE_OTHER): Payer: Medicare Other | Admitting: Cardiovascular Disease

## 2019-08-28 VITALS — BP 126/88 | HR 78 | Ht 68.0 in | Wt 220.0 lb

## 2019-08-28 DIAGNOSIS — I10 Essential (primary) hypertension: Secondary | ICD-10-CM | POA: Diagnosis not present

## 2019-08-28 MED ORDER — VALSARTAN 320 MG PO TABS: 320.0000 mg | ORAL_TABLET | Freq: Every day | ORAL | 3 refills | Status: DC

## 2019-08-28 NOTE — Patient Instructions (Signed)
Medication Instructions:  Your physician has recommended you make the following change in your medication:  1-Increase Valsartan 320 mg by mouth daily.  Please call your local [facility] to see if they have Losartan 100 mg tablets. If they do, call our office and let us know and we will change your prescription.   *If you need a refill on your cardiac medications before your next appointment, please call your pharmacy*  Lab Work:  If you have labs (blood work) drawn today and your tests are completely normal, you will receive your results only by: Marland Kitchen MyChart Message (if you have MyChart) OR . A paper copy in the mail If you have any lab test that is abnormal or we need to change your treatment, we will call you to review the results.  Testing/Procedures: None ordered today.  Follow-Up: At [facility], you and your health needs are our priority.  As part of our continuing mission to provide you with exceptional heart care, we have created designated Provider Care Teams.  These Care Teams include your primary Cardiologist (physician) and Advanced Practice Providers (APPs -  Physician Assistants and Nurse Practitioners) who all work together to provide you with the care you need, when you need it.  Your next appointment:   12 months  The format for your next appointment:   In Person  Provider:   You may see [name], MD or one of the following Advanced Practice Providers on your designated Care Team:    Truitt Merle, NP  Cecilie Kicks, NP  Kathyrn Drown, NP

## 2019-08-30 MED ORDER — LOSARTAN POTASSIUM 100 MG PO TABS: 100.0000 mg | ORAL_TABLET | Freq: Every day | ORAL | 3 refills | Status: DC

## 2019-10-27 ENCOUNTER — Other Ambulatory Visit: Payer: Self-pay | Admitting: Cardiovascular Disease

## 2019-11-10 HISTORY — PX: CHOLECYSTECTOMY: SHX55

## 2019-12-14 ENCOUNTER — Other Ambulatory Visit: Payer: Self-pay | Admitting: Cardiovascular Disease

## 2019-12-14 MED ORDER — PANTOPRAZOLE SODIUM 40 MG PO TBEC: 40.0000 mg | DELAYED_RELEASE_TABLET | Freq: Every day | ORAL | 2 refills | Status: DC

## 2019-12-14 NOTE — Telephone Encounter (Signed)
Pt's medication was sent to pt's pharmacy as requested. Confirmation received.  °

## 2020-02-12 DIAGNOSIS — M25562 Pain in left knee: Secondary | ICD-10-CM | POA: Diagnosis not present

## 2020-03-27 ENCOUNTER — Other Ambulatory Visit: Payer: Self-pay | Admitting: Cardiovascular Disease

## 2020-05-30 DIAGNOSIS — R109 Unspecified abdominal pain: Secondary | ICD-10-CM | POA: Diagnosis not present

## 2020-05-30 DIAGNOSIS — K219 Gastro-esophageal reflux disease without esophagitis: Secondary | ICD-10-CM | POA: Diagnosis not present

## 2020-06-04 DIAGNOSIS — N200 Calculus of kidney: Secondary | ICD-10-CM | POA: Diagnosis not present

## 2020-06-04 DIAGNOSIS — R109 Unspecified abdominal pain: Secondary | ICD-10-CM | POA: Diagnosis not present

## 2020-06-11 ENCOUNTER — Other Ambulatory Visit: Payer: Self-pay

## 2020-06-11 ENCOUNTER — Encounter: Payer: Self-pay | Admitting: Sports Medicine

## 2020-06-11 ENCOUNTER — Ambulatory Visit (INDEPENDENT_AMBULATORY_CARE_PROVIDER_SITE_OTHER): Payer: Medicare Other

## 2020-06-11 ENCOUNTER — Ambulatory Visit (INDEPENDENT_AMBULATORY_CARE_PROVIDER_SITE_OTHER): Payer: Medicare Other | Admitting: Sports Medicine

## 2020-06-11 DIAGNOSIS — M79672 Pain in left foot: Secondary | ICD-10-CM

## 2020-06-11 DIAGNOSIS — M779 Enthesopathy, unspecified: Secondary | ICD-10-CM | POA: Diagnosis not present

## 2020-06-11 DIAGNOSIS — M25572 Pain in left ankle and joints of left foot: Secondary | ICD-10-CM | POA: Diagnosis not present

## 2020-06-11 DIAGNOSIS — M19072 Primary osteoarthritis, left ankle and foot: Secondary | ICD-10-CM

## 2020-06-11 DIAGNOSIS — M722 Plantar fascial fibromatosis: Secondary | ICD-10-CM

## 2020-06-11 MED ORDER — TRIAMCINOLONE ACETONIDE 10 MG/ML IJ SUSP
10.0000 mg | Freq: Once | INTRAMUSCULAR | Status: AC
  Administered 2020-06-11: 10 mg

## 2020-06-11 NOTE — Progress Notes (Signed)
Subjective: Barry Mcdonald is a 69 y.o. male patient who presents to office for evaluation of left foot pain. Patient complains of progressive pain especially over the last 6 months to year in the left foot at the top side. Pain with direct touch to area on left foot that is dull and achy in nature and slows motion. Reports when his wife is sleeping next to him in bed and puts pressure to area it hurts. Patient has tried not to touch the area with no relief in symptoms. Patient denies any other pedal complaints. Denies injury/trip/fall/sprain/any causative factors.  Patient is assisted by wife this visit.    Patient Active Problem List   Diagnosis Date Noted   Upper airway cough syndrome 12/23/2015   Diastasis recti 97/67/3419   Umbilical hernia 37/90/2409   Prostatism 04/20/2012   MIXED HYPERLIPIDEMIA 11/14/2009   Essential hypertension 10/28/2009   GERD 10/28/2009   CHEST PAIN 10/28/2009    Current Outpatient Medications on File Prior to Visit  Medication Sig Dispense Refill   alfuzosin (UROXATRAL) 10 MG 24 hr tablet Take 10 mg by mouth daily.     furosemide (LASIX) 20 MG tablet Take 1 tablet (20 mg total) by mouth daily. Pt needs to schedule appt with provider for further refills 90 tablet 0   hydrochlorothiazide (MICROZIDE) 12.5 MG capsule Take 1 capsule (12.5 mg total) by mouth daily. 90 capsule 3   losartan (COZAAR) 100 MG tablet Take 1 tablet (100 mg total) by mouth daily. 90 tablet 3   pantoprazole (PROTONIX) 40 MG tablet Take 1 tablet (40 mg total) by mouth daily. 90 tablet 2   No current facility-administered medications on file prior to visit.    No Known Allergies  Objective:  General: Alert and oriented x3 in no acute distress  Dermatology: No open lesions bilateral lower extremities, no webspace macerations, no ecchymosis bilateral, all nails x 10 are well manicured.  Vascular: Dorsalis Pedis and Posterior Tibial pedal pulses palpable, Capillary Fill  Time 3 seconds,(+) pedal hair growth bilateral, no edema bilateral lower extremities, Temperature gradient within normal limits.  Neurology: Johney Maine sensation intact via light touch bilateral.  Musculoskeletal: Mild tenderness with palpation at Left foot at dorsal lateral aspect at the sinus tarsi. Mild guarding with ROM on left.  Bunion deformity noted.  Strength within normal limits in all groups bilateral.   Gait: Antalgic gait  Xrays  Left Foot   Impression: No fracture or dislocation, there is diffuse arthritis and bunion deformity noted.  Assessment and Plan: Problem List Items Addressed This Visit    None    Visit Diagnoses    Tendonitis    -  Primary   Relevant Orders   DG Foot Complete Left   Sinus tarsi syndrome of left foot       Arthritis of foot, left       Left foot pain           -Complete examination performed -Xrays reviewed -Discussed treatment options for likely tendinitis versus sinus tarsi capsulitis with underlying arthritis -After oral consent and aseptic prep, injected a mixture containing 1 ml of 2%  plain lidocaine, 1 ml 0.5% plain marcaine, 0.5 ml of kenalog 10 and 0.5 ml of dexamethasone phosphate into left sinus tarsi without complication. Post-injection care discussed with patient.  -Advised ice rest elevation and good supportive shoes daily for foot type -Advised patient to change position when sleeping in bed at night to prevent from direct pressure to the painful  area of the left foot -Patient to return to office if fails to continue to improve or sooner if condition worsens.  Landis Martins, DPM

## 2020-07-02 ENCOUNTER — Other Ambulatory Visit: Payer: Self-pay | Admitting: Cardiovascular Disease

## 2020-07-26 NOTE — Progress Notes (Signed)
Patient ID: Barry Mcdonald, male   DOB: 01/14/1951, 69 y.o.   MRN: [id number]     69 y.o. f/u HTN and atypical chest pain. Cath 2013 normal cors. Norvasc not effective for BP On diuretic for LE edema.    More sedentary during [place] girlfriend Rx breast cancer lumpectomy/XRT 2020 He had his GB out 2020  Enjoys going to beach and [place] a lot   No chest pain  Have some left foot pain from arthritis and bunion deformity Had steroid Injection to joint 06/11/20   He has high sodium diet and is overweight BP elevated Says he's compliant with meds    ROS: Denies fever, malais, weight loss, blurry vision, decreased visual acuity, cough, sputum, SOB, hemoptysis, pleuritic pain, palpitaitons, heartburn, abdominal pain, melena, lower extremity edema, claudication, or rash.  All other systems reviewed and negative  General: BP (!) 168/74   Pulse 63   Ht 5\' 8"  (1.727 m)   Wt 231 lb (104.8 kg)   SpO2 97%   BMI 35.12 kg/m    Affect appropriate Overweight white male  HEENT: normal Neck supple with no adenopathy JVP normal no bruits no thyromegaly Lungs clear with no wheezing and good diaphragmatic motion Heart:  S1/S2 no murmur, no rub, gallop or click PMI normal Abdomen: benighn, BS positve, no tenderness, no AAA ventral hernia  Rectus diastasis no bruit.  No HSM or HJR Distal pulses intact with no bruits Plus one edema Neuro non-focal Skin warm and dry No muscular weakness    Current Outpatient Medications  Medication Sig Dispense Refill  . alfuzosin (UROXATRAL) 10 MG 24 hr tablet Take 10 mg by mouth daily.    . hydrochlorothiazide (MICROZIDE) 12.5 MG capsule Take 1 capsule (12.5 mg total) by mouth daily. 90 capsule 3  . losartan (COZAAR) 100 MG tablet TAKE 1 TABLET BY MOUTH DAILY 90 tablet 3  . pantoprazole (PROTONIX) 40 MG tablet Take 1 tablet (40 mg total) by mouth daily. 90 tablet 2   No current facility-administered medications for this visit.     Allergies  Patient has no known allergies.  Electrocardiogram:  10/28/18 NSR normal ECG   Assessment and Plan HTN:  Continue diuretic and ARB start Norvasc 5 mg daily in afternoon  Edema: improved with diuretic -> dependant  Check labs  GERD:  Continue proton pump inhibiter and low carb diet GI:  Rectus diastasis stable no need for surgery better with weight loss Post GB removal with no biliary cholic   Labs/ Norvasc 5 mg  F/U 3 months   [name]

## 2020-08-02 DIAGNOSIS — R351 Nocturia: Secondary | ICD-10-CM | POA: Diagnosis not present

## 2020-08-02 DIAGNOSIS — N401 Enlarged prostate with lower urinary tract symptoms: Secondary | ICD-10-CM | POA: Diagnosis not present

## 2020-08-09 ENCOUNTER — Encounter: Payer: Self-pay | Admitting: Cardiovascular Disease

## 2020-08-09 ENCOUNTER — Ambulatory Visit (INDEPENDENT_AMBULATORY_CARE_PROVIDER_SITE_OTHER): Payer: Medicare Other | Admitting: Cardiovascular Disease

## 2020-08-09 ENCOUNTER — Other Ambulatory Visit: Payer: Self-pay

## 2020-08-09 VITALS — BP 168/74 | HR 63 | Ht 68.0 in | Wt 231.0 lb

## 2020-08-09 DIAGNOSIS — E782 Mixed hyperlipidemia: Secondary | ICD-10-CM

## 2020-08-09 DIAGNOSIS — I10 Essential (primary) hypertension: Secondary | ICD-10-CM

## 2020-08-09 DIAGNOSIS — R7989 Other specified abnormal findings of blood chemistry: Secondary | ICD-10-CM | POA: Diagnosis not present

## 2020-08-09 MED ORDER — AMLODIPINE BESYLATE 5 MG PO TABS
5.0000 mg | ORAL_TABLET | Freq: Every day | ORAL | 3 refills | Status: DC
Start: 2020-08-09 — End: 2020-10-09

## 2020-08-09 NOTE — Patient Instructions (Addendum)
Medication Instructions:   Your physician has recommended you make the following change in your medication:  1-START Amlodipine 5 mg by mouth daily.  *If you need a refill on your cardiac medications before your next appointment, please call your pharmacy*  Lab Work: Your physician recommends that you return for lab work in: 1 week for fasting lipid panel, CBC, CMET, HgbA1c   If you have labs (blood work) drawn today and your tests are completely normal, you will receive your results only by: Marland Kitchen MyChart Message (if you have MyChart) OR . A paper copy in the mail If you have any lab test that is abnormal or we need to change your treatment, we will call you to review the results.  Testing/Procedures: None ordered today.  Follow-Up: At [facility], you and your health needs are our priority.  As part of our continuing mission to provide you with exceptional heart care, we have created designated Provider Care Teams.  These Care Teams include your primary Cardiologist (physician) and Advanced Practice Providers (APPs -  Physician Assistants and Nurse Practitioners) who all work together to provide you with the care you need, when you need it.  We recommend signing up for the patient portal called "MyChart".  Sign up information is provided on this After Visit Summary.  MyChart is used to connect with patients for Virtual Visits (Telemedicine).  Patients are able to view lab/test results, encounter notes, upcoming appointments, etc.  Non-urgent messages can be sent to your provider as well.   To learn more about what you can do with MyChart, go to [email or web address].    Your next appointment:   6 month(s)  The format for your next appointment:   In Person  Provider:   You may see [name], MD or one of the following Advanced Practice Providers on your designated Care Team:    Truitt Merle, NP  Cecilie Kicks, NP  Kathyrn Drown, NP

## 2020-08-19 ENCOUNTER — Other Ambulatory Visit: Payer: Medicare Other

## 2020-08-19 ENCOUNTER — Other Ambulatory Visit: Payer: Self-pay

## 2020-08-19 DIAGNOSIS — R7989 Other specified abnormal findings of blood chemistry: Secondary | ICD-10-CM

## 2020-08-19 DIAGNOSIS — E782 Mixed hyperlipidemia: Secondary | ICD-10-CM | POA: Diagnosis not present

## 2020-08-19 DIAGNOSIS — I10 Essential (primary) hypertension: Secondary | ICD-10-CM

## 2020-08-20 ENCOUNTER — Telehealth: Payer: Self-pay | Admitting: Cardiovascular Disease

## 2020-08-20 LAB — CBC WITH DIFFERENTIAL/PLATELET
Basophils Absolute: 0.1 10*3/uL (ref 0.0–0.2)
Basos: 1 %
EOS (ABSOLUTE): 0.3 10*3/uL (ref 0.0–0.4)
Eos: 5 %
Hematocrit: 41 % (ref 37.5–51.0)
Hemoglobin: 14.8 g/dL (ref 13.0–17.7)
Immature Grans (Abs): 0 10*3/uL (ref 0.0–0.1)
Immature Granulocytes: 0 %
Lymphocytes Absolute: 2.1 10*3/uL (ref 0.7–3.1)
Lymphs: 33 %
MCH: 33.1 pg — ABNORMAL HIGH (ref 26.6–33.0)
MCHC: 36.1 g/dL — ABNORMAL HIGH (ref 31.5–35.7)
MCV: 92 fL (ref 79–97)
Monocytes Absolute: 0.5 10*3/uL (ref 0.1–0.9)
Monocytes: 8 %
Neutrophils Absolute: 3.4 10*3/uL (ref 1.4–7.0)
Neutrophils: 53 %
Platelets: 251 10*3/uL (ref 150–450)
RBC: 4.47 x10E6/uL (ref 4.14–5.80)
RDW: 11.6 % (ref 11.6–15.4)
WBC: 6.3 10*3/uL (ref 3.4–10.8)

## 2020-08-20 LAB — LIPID PANEL
Chol/HDL Ratio: 4 ratio (ref 0.0–5.0)
Cholesterol, Total: 137 mg/dL (ref 100–199)
HDL: 34 mg/dL — ABNORMAL LOW (ref >39)
LDL Chol Calc (NIH): 87 mg/dL (ref 0–99)
Triglycerides: 82 mg/dL (ref 0–149)
VLDL Cholesterol Cal: 16 mg/dL (ref 5–40)

## 2020-08-20 LAB — COMPREHENSIVE METABOLIC PANEL
ALT: 60 IU/L — ABNORMAL HIGH (ref 0–44)
AST: 23 IU/L (ref 0–40)
Albumin/Globulin Ratio: 2.4 — ABNORMAL HIGH (ref 1.2–2.2)
Albumin: 4.1 g/dL (ref 3.8–4.8)
Alkaline Phosphatase: 91 IU/L (ref 44–121)
BUN/Creatinine Ratio: 13 (ref 10–24)
BUN: 12 mg/dL (ref 8–27)
Bilirubin Total: 0.6 mg/dL (ref 0.0–1.2)
CO2: 27 mmol/L (ref 20–29)
Calcium: 9.4 mg/dL (ref 8.6–10.2)
Chloride: 105 mmol/L (ref 96–106)
Creatinine, Ser: 0.96 mg/dL (ref 0.76–1.27)
GFR calc Af Amer: 94 mL/min/{1.73_m2} (ref >59)
GFR calc non Af Amer: 81 mL/min/{1.73_m2} (ref >59)
Globulin, Total: 1.7 g/dL (ref 1.5–4.5)
Glucose: 106 mg/dL — ABNORMAL HIGH (ref 65–99)
Potassium: 4.2 mmol/L (ref 3.5–5.2)
Sodium: 142 mmol/L (ref 134–144)
Total Protein: 5.8 g/dL — ABNORMAL LOW (ref 6.0–8.5)

## 2020-08-20 LAB — HEMOGLOBIN A1C
Est. average glucose Bld gHb Est-mCnc: 117 mg/dL
Hgb A1c MFr Bld: 5.7 % — ABNORMAL HIGH (ref 4.8–5.6)

## 2020-08-20 NOTE — Telephone Encounter (Signed)
The patient has been notified of the result and verbalized understanding.  All questions (if any) were answered. Michaelyn Barter, RN 08/20/2020 3:12 PM

## 2020-08-20 NOTE — Telephone Encounter (Signed)
Patient returning call for lab results. 

## 2020-09-01 DIAGNOSIS — B349 Viral infection, unspecified: Secondary | ICD-10-CM | POA: Diagnosis not present

## 2020-09-01 DIAGNOSIS — Z20828 Contact with and (suspected) exposure to other viral communicable diseases: Secondary | ICD-10-CM | POA: Diagnosis not present

## 2020-09-03 DIAGNOSIS — J0100 Acute maxillary sinusitis, unspecified: Secondary | ICD-10-CM | POA: Diagnosis not present

## 2020-09-03 DIAGNOSIS — R0981 Nasal congestion: Secondary | ICD-10-CM | POA: Diagnosis not present

## 2020-09-11 DIAGNOSIS — K219 Gastro-esophageal reflux disease without esophagitis: Secondary | ICD-10-CM | POA: Diagnosis not present

## 2020-09-11 DIAGNOSIS — R1013 Epigastric pain: Secondary | ICD-10-CM | POA: Diagnosis not present

## 2020-09-13 DIAGNOSIS — K449 Diaphragmatic hernia without obstruction or gangrene: Secondary | ICD-10-CM | POA: Diagnosis not present

## 2020-09-13 DIAGNOSIS — K5710 Diverticulosis of small intestine without perforation or abscess without bleeding: Secondary | ICD-10-CM | POA: Diagnosis not present

## 2020-09-13 DIAGNOSIS — R109 Unspecified abdominal pain: Secondary | ICD-10-CM | POA: Diagnosis not present

## 2020-09-20 DIAGNOSIS — Z20828 Contact with and (suspected) exposure to other viral communicable diseases: Secondary | ICD-10-CM | POA: Diagnosis not present

## 2020-09-25 DIAGNOSIS — S8391XA Sprain of unspecified site of right knee, initial encounter: Secondary | ICD-10-CM | POA: Diagnosis not present

## 2020-09-27 DIAGNOSIS — K9289 Other specified diseases of the digestive system: Secondary | ICD-10-CM | POA: Diagnosis not present

## 2020-09-27 DIAGNOSIS — K2950 Unspecified chronic gastritis without bleeding: Secondary | ICD-10-CM | POA: Diagnosis not present

## 2020-09-27 DIAGNOSIS — K449 Diaphragmatic hernia without obstruction or gangrene: Secondary | ICD-10-CM | POA: Diagnosis not present

## 2020-09-27 DIAGNOSIS — K208 Other esophagitis without bleeding: Secondary | ICD-10-CM | POA: Diagnosis not present

## 2020-09-27 DIAGNOSIS — K319 Disease of stomach and duodenum, unspecified: Secondary | ICD-10-CM | POA: Diagnosis not present

## 2020-09-27 DIAGNOSIS — Z8719 Personal history of other diseases of the digestive system: Secondary | ICD-10-CM | POA: Diagnosis not present

## 2020-09-27 DIAGNOSIS — R1013 Epigastric pain: Secondary | ICD-10-CM | POA: Diagnosis not present

## 2020-10-07 DIAGNOSIS — M25561 Pain in right knee: Secondary | ICD-10-CM | POA: Diagnosis not present

## 2020-10-07 DIAGNOSIS — M25461 Effusion, right knee: Secondary | ICD-10-CM | POA: Diagnosis not present

## 2020-10-08 ENCOUNTER — Other Ambulatory Visit: Payer: Self-pay | Admitting: Cardiovascular Disease

## 2020-10-09 ENCOUNTER — Telehealth: Payer: Self-pay | Admitting: Cardiovascular Disease

## 2020-10-09 MED ORDER — AMLODIPINE BESYLATE 5 MG PO TABS
5.0000 mg | ORAL_TABLET | Freq: Every day | ORAL | 3 refills | Status: DC
Start: 2020-10-09 — End: 2021-10-23

## 2020-10-09 MED ORDER — LOSARTAN POTASSIUM 100 MG PO TABS
100.0000 mg | ORAL_TABLET | Freq: Every day | ORAL | 3 refills | Status: DC
Start: 2020-10-09 — End: 2021-10-23

## 2020-10-09 MED ORDER — FUROSEMIDE 20 MG PO TABS
20.0000 mg | ORAL_TABLET | Freq: Every day | ORAL | 2 refills | Status: DC
Start: 2020-10-09 — End: 2021-10-23

## 2020-10-09 MED ORDER — PANTOPRAZOLE SODIUM 40 MG PO TBEC
40.0000 mg | DELAYED_RELEASE_TABLET | Freq: Every day | ORAL | 3 refills | Status: DC
Start: 2020-10-09 — End: 2021-10-23

## 2020-10-09 NOTE — Telephone Encounter (Signed)
Patient called and want to talk with Dr. Johnsie Cancel or a nurse. Please call back

## 2020-10-09 NOTE — Telephone Encounter (Signed)
Patient needed refills on his heart medications.

## 2020-10-22 DIAGNOSIS — H2513 Age-related nuclear cataract, bilateral: Secondary | ICD-10-CM | POA: Diagnosis not present

## 2020-10-22 DIAGNOSIS — H18413 Arcus senilis, bilateral: Secondary | ICD-10-CM | POA: Diagnosis not present

## 2020-10-22 DIAGNOSIS — H524 Presbyopia: Secondary | ICD-10-CM | POA: Diagnosis not present

## 2020-11-14 NOTE — Progress Notes (Deleted)
Patient ID: Barry Mcdonald, male   DOB: 1951-04-22, 70 y.o.   MRN: [id number]     70 y.o. f/u HTN and atypical chest pain. Cath 2013 normal cors. Norvasc not effective for BP On diuretic for LE edema.    More sedentary during [place] Barry Mcdonald girlfriend Rx breast cancer lumpectomy/XRT 2020 He had his GB out 2020  Enjoys going to beach and [place] a lot   No chest pain  Have some left foot pain from arthritis and bunion deformity Had steroid Injection to joint 06/11/20   He has high sodium diet and is overweight BP elevated Says he's compliant with meds Norvasc added to lasix and losartan on 10/09/20   ***    ROS: Denies fever, malais, weight loss, blurry vision, decreased visual acuity, cough, sputum, SOB, hemoptysis, pleuritic pain, palpitaitons, heartburn, abdominal pain, melena, lower extremity edema, claudication, or rash.  All other systems reviewed and negative  General: There were no vitals taken for this visit.   Affect appropriate Overweight white male  HEENT: normal Neck supple with no adenopathy JVP normal no bruits no thyromegaly Lungs clear with no wheezing and good diaphragmatic motion Heart:  S1/S2 no murmur, no rub, gallop or click PMI normal Abdomen: benighn, BS positve, no tenderness, no AAA ventral hernia  Rectus diastasis no bruit.  No HSM or HJR Distal pulses intact with no bruits Plus one edema Neuro non-focal Skin warm and dry No muscular weakness    Current Outpatient Medications  Medication Sig Dispense Refill  . alfuzosin (UROXATRAL) 10 MG 24 hr tablet Take 10 mg by mouth daily.    Marland Kitchen amLODipine (NORVASC) 5 MG tablet Take 1 tablet (5 mg total) by mouth daily. 90 tablet 3  . furosemide (LASIX) 20 MG tablet Take 1 tablet (20 mg total) by mouth daily. 90 tablet 2  . losartan (COZAAR) 100 MG tablet Take 1 tablet (100 mg total) by mouth daily. 90 tablet 3  . pantoprazole (PROTONIX) 40 MG tablet Take 1 tablet (40 mg total) by mouth daily. 90 tablet  3   No current facility-administered medications for this visit.    Allergies  Patient has no known allergies.  Electrocardiogram:  10/28/18 NSR normal ECG   Assessment and Plan HTN:  Continue diuretic, ARB and norvasc improved  Edema: improved with diuretic -> dependant  Cr/K ok  GERD:  Continue proton pump inhibiter and low carb diet GI:  Rectus diastasis stable no need for surgery better with weight loss Post GB removal with no biliary cholic   F/U in a year   [employer]

## 2020-11-19 ENCOUNTER — Ambulatory Visit: Payer: Medicare Other | Admitting: Cardiovascular Disease

## 2020-12-30 DIAGNOSIS — M19072 Primary osteoarthritis, left ankle and foot: Secondary | ICD-10-CM | POA: Diagnosis not present

## 2021-03-14 DIAGNOSIS — J324 Chronic pansinusitis: Secondary | ICD-10-CM | POA: Diagnosis not present

## 2021-05-28 ENCOUNTER — Other Ambulatory Visit: Payer: Self-pay

## 2021-05-28 ENCOUNTER — Ambulatory Visit (INDEPENDENT_AMBULATORY_CARE_PROVIDER_SITE_OTHER): Payer: Medicare Other | Admitting: Sports Medicine

## 2021-05-28 ENCOUNTER — Encounter: Payer: Self-pay | Admitting: Sports Medicine

## 2021-05-28 DIAGNOSIS — M79674 Pain in right toe(s): Secondary | ICD-10-CM

## 2021-05-28 DIAGNOSIS — L600 Ingrowing nail: Secondary | ICD-10-CM

## 2021-05-28 DIAGNOSIS — M79675 Pain in left toe(s): Secondary | ICD-10-CM

## 2021-05-28 NOTE — Progress Notes (Signed)
Subjective: Barry Mcdonald is a 70 y.o. male patient presents to office today complaining of a moderately painful left greater than right medial border of both hallux nails states that for years he has gotten pedicures but continues to grow out and be painful on average 8 out of 10 pain patient denies any current redness swelling or drainage but does want a more permanent removal of these ingrown corners.  Denies any other pedal complaints at this time.  Patient Active Problem List   Diagnosis Date Noted   Upper airway cough syndrome 12/23/2015   Diastasis recti 43/15/4008   Umbilical hernia 67/61/9509   Prostatism 04/20/2012   MIXED HYPERLIPIDEMIA 11/14/2009   Essential hypertension 10/28/2009   GERD 10/28/2009   CHEST PAIN 10/28/2009    Current Outpatient Medications on File Prior to Visit  Medication Sig Dispense Refill   alfuzosin (UROXATRAL) 10 MG 24 hr tablet Take 10 mg by mouth daily.     amLODipine (NORVASC) 5 MG tablet Take 1 tablet (5 mg total) by mouth daily. 90 tablet 3   furosemide (LASIX) 20 MG tablet Take 1 tablet (20 mg total) by mouth daily. 90 tablet 2   losartan (COZAAR) 100 MG tablet Take 1 tablet (100 mg total) by mouth daily. 90 tablet 3   pantoprazole (PROTONIX) 40 MG tablet Take 1 tablet (40 mg total) by mouth daily. 90 tablet 3   No current facility-administered medications on file prior to visit.    No Known Allergies  Objective:  There were no vitals filed for this visit.  General: Well developed, nourished, in no acute distress, alert and oriented x3   Dermatology: Skin is warm, dry and supple bilateral.  Right and left hallux nail appears to be moderately incurvated with hyperkeratosis formation at the distal aspects of the medial nail border. (+) Erythema.  Right greater than left.  Minimal edema. (-) serosanguous drainage present. The remaining nails appear unremarkable at this time. There are no open sores, lesions or other signs of infection  present.  Vascular: Dorsalis Pedis artery and Posterior Tibial artery pedal pulses are 1/4 bilateral with immedate capillary fill time. Pedal hair growth present. No lower extremity edema.   Neruologic: Grossly intact via light touch bilateral.  Musculoskeletal: Tenderness to palpation of the right and left hallux medial nail fold(s). Muscular strength within normal limits in all groups bilateral.   Assesement and Plan: Problem List Items Addressed This Visit   None Visit Diagnoses     Ingrown nail    -  Primary   Toe pain, bilateral           -Discussed treatment alternatives and plan of care; Explained permanent/temporary nail avulsion and post procedure course to patient. Patient elects for PNA right and left hallux medial nail borders with phenol - After a verbal and written consent, injected 3 ml of a 50:50 mixture of 2% plain  lidocaine and 0.5% plain marcaine in a normal hallux block fashion. Next, a  betadine prep was performed. Anesthesia was tested and found to be appropriate.  The offending right and left hallux medial nail borders were then incised from the hyponychium to the epinychium. The offending nail border was removed and cleared from the field. The area was curretted for any remaining nail or spicules. Phenol application performed and the area was then flushed with alcohol and dressed with antibiotic cream and a dry sterile dressing. -Patient was instructed to leave the dressing intact for today and begin soaking  in  a weak solution of betadine or Epsom salt and water tomorrow. Patient was instructed to  soak for 15-20 minutes each day and apply neosporin/corticosporin and a gauze or bandaid dressing each day. -Patient was instructed to monitor the toes for signs of infection and return to office if toe becomes red, hot or swollen. -Advised ice, elevation, and tylenol or motrin if needed for pain.  -Patient is to return in 2 weeks for follow up care/nail check or  sooner if problems arise.  Landis Martins, DPM

## 2021-05-28 NOTE — Patient Instructions (Signed)

## 2021-06-17 ENCOUNTER — Encounter: Payer: Self-pay | Admitting: Sports Medicine

## 2021-06-17 ENCOUNTER — Other Ambulatory Visit: Payer: Self-pay

## 2021-06-17 ENCOUNTER — Ambulatory Visit (INDEPENDENT_AMBULATORY_CARE_PROVIDER_SITE_OTHER): Payer: Medicare Other | Admitting: Sports Medicine

## 2021-06-17 DIAGNOSIS — M79674 Pain in right toe(s): Secondary | ICD-10-CM

## 2021-06-17 DIAGNOSIS — M79675 Pain in left toe(s): Secondary | ICD-10-CM

## 2021-06-17 DIAGNOSIS — Z9889 Other specified postprocedural states: Secondary | ICD-10-CM

## 2021-06-17 NOTE — Progress Notes (Signed)
Subjective: Barry Mcdonald is a 70 y.o. male patient returns to office today for follow up evaluation after having Right/Left Hallux medial permanent nail avulsion performed on 05/28/2021.  Patient reports that he is doing well with no problems denies any redness swelling drainage reports that he is making a scab over the area is really good.  Patient is assisted by wife who reports that patient did try to peel away the dead skin but otherwise is doing good.  Patient Active Problem List   Diagnosis Date Noted   Upper airway cough syndrome 12/23/2015   Diastasis recti 123456   Umbilical hernia 123456   Prostatism 04/20/2012   MIXED HYPERLIPIDEMIA 11/14/2009   Essential hypertension 10/28/2009   GERD 10/28/2009   CHEST PAIN 10/28/2009    Current Outpatient Medications on File Prior to Visit  Medication Sig Dispense Refill   alfuzosin (UROXATRAL) 10 MG 24 hr tablet Take 10 mg by mouth daily.     amLODipine (NORVASC) 5 MG tablet Take 1 tablet (5 mg total) by mouth daily. 90 tablet 3   furosemide (LASIX) 20 MG tablet Take 1 tablet (20 mg total) by mouth daily. 90 tablet 2   losartan (COZAAR) 100 MG tablet Take 1 tablet (100 mg total) by mouth daily. 90 tablet 3   pantoprazole (PROTONIX) 40 MG tablet Take 1 tablet (40 mg total) by mouth daily. 90 tablet 3   No current facility-administered medications on file prior to visit.    No Known Allergies  Objective:  General: Well developed, nourished, in no acute distress, alert and oriented x3   Dermatology: Skin is warm, dry and supple bilateral.  Right and left medial hallux nail borders are well-healed.  Minimal surrounding eschar/scab. (+/-) Erythema.  No edema.  No serosanguous drainage present. The remaining nails appear unremarkable at this time. There are no other lesions or other signs of infection  present.  Neurovascular status: Intact. No lower extremity swelling; No pain with calf compression  bilateral.  Musculoskeletal: No tenderness to palpation to the right or left hallux medial nail fold(s). Muscular strength within normal limits bilateral.   Assesement and Plan: Problem List Items Addressed This Visit   None Visit Diagnoses     Status post nail surgery    -  Primary   Toe pain, bilateral           -Examined patient  -Discussed plan of care with patient. -Patient may discontinue soaking May leave open to air do not need to cover or Band-Aid since area is well-healed -Patient is to return as needed or sooner if problems arise.  Landis Martins, DPM

## 2021-09-01 DIAGNOSIS — N401 Enlarged prostate with lower urinary tract symptoms: Secondary | ICD-10-CM | POA: Diagnosis not present

## 2021-09-01 DIAGNOSIS — R351 Nocturia: Secondary | ICD-10-CM | POA: Diagnosis not present

## 2021-09-01 DIAGNOSIS — N5201 Erectile dysfunction due to arterial insufficiency: Secondary | ICD-10-CM | POA: Diagnosis not present

## 2021-09-01 DIAGNOSIS — Z125 Encounter for screening for malignant neoplasm of prostate: Secondary | ICD-10-CM | POA: Diagnosis not present

## 2021-09-15 DIAGNOSIS — N5201 Erectile dysfunction due to arterial insufficiency: Secondary | ICD-10-CM | POA: Diagnosis not present

## 2021-09-22 DIAGNOSIS — M19032 Primary osteoarthritis, left wrist: Secondary | ICD-10-CM | POA: Diagnosis not present

## 2021-09-22 DIAGNOSIS — M19042 Primary osteoarthritis, left hand: Secondary | ICD-10-CM | POA: Diagnosis not present

## 2021-09-22 DIAGNOSIS — M19049 Primary osteoarthritis, unspecified hand: Secondary | ICD-10-CM | POA: Diagnosis not present

## 2021-10-10 DIAGNOSIS — T485X5A Adverse effect of other anti-common-cold drugs, initial encounter: Secondary | ICD-10-CM | POA: Diagnosis not present

## 2021-10-10 DIAGNOSIS — Z9889 Other specified postprocedural states: Secondary | ICD-10-CM | POA: Diagnosis not present

## 2021-10-10 DIAGNOSIS — J310 Chronic rhinitis: Secondary | ICD-10-CM | POA: Diagnosis not present

## 2021-10-10 DIAGNOSIS — R0981 Nasal congestion: Secondary | ICD-10-CM | POA: Diagnosis not present

## 2021-10-21 DIAGNOSIS — L820 Inflamed seborrheic keratosis: Secondary | ICD-10-CM | POA: Diagnosis not present

## 2021-10-21 DIAGNOSIS — L821 Other seborrheic keratosis: Secondary | ICD-10-CM | POA: Diagnosis not present

## 2021-10-21 DIAGNOSIS — D1801 Hemangioma of skin and subcutaneous tissue: Secondary | ICD-10-CM | POA: Diagnosis not present

## 2021-10-21 DIAGNOSIS — L711 Rhinophyma: Secondary | ICD-10-CM | POA: Diagnosis not present

## 2021-10-22 DIAGNOSIS — J111 Influenza due to unidentified influenza virus with other respiratory manifestations: Secondary | ICD-10-CM | POA: Diagnosis not present

## 2021-10-23 ENCOUNTER — Other Ambulatory Visit: Payer: Self-pay | Admitting: Cardiovascular Disease

## 2021-10-27 DIAGNOSIS — J019 Acute sinusitis, unspecified: Secondary | ICD-10-CM | POA: Diagnosis not present

## 2021-10-28 DIAGNOSIS — H524 Presbyopia: Secondary | ICD-10-CM | POA: Diagnosis not present

## 2021-10-28 DIAGNOSIS — H2513 Age-related nuclear cataract, bilateral: Secondary | ICD-10-CM | POA: Diagnosis not present

## 2021-10-28 DIAGNOSIS — H18413 Arcus senilis, bilateral: Secondary | ICD-10-CM | POA: Diagnosis not present

## 2021-11-13 NOTE — Progress Notes (Signed)
Patient ID: Barry Mcdonald, male   DOB: [birth year], 71 y.o.   MRN: [id number]     71 y.o. f/u HTN and atypical chest pain. Cath 2013 normal cors. On diuretic,norvasc and ARB for HTN   More sedentary during [place] girlfriend Rx breast cancer lumpectomy/XRT 2020 He had his GB out 2020  Enjoys going to beach and [place] a lot   Had right/left Hallux medial nail avulsion 05/28/21   No cardiac issues Has some small dogs at home but doesn't walk them  Needs lab and refills has no primary   ROS: Denies fever, malais, weight loss, blurry vision, decreased visual acuity, cough, sputum, SOB, hemoptysis, pleuritic pain, palpitaitons, heartburn, abdominal pain, melena, lower extremity edema, claudication, or rash.  All other systems reviewed and negative  General: BP 122/76    Pulse 68    Ht 5\' 8"  (1.727 m)    Wt 226 lb (102.5 kg)    SpO2 98%    BMI 34.36 kg/m    Affect appropriate Overweight white male  HEENT: normal Neck supple with no adenopathy JVP normal no bruits no thyromegaly Lungs clear with no wheezing and good diaphragmatic motion Heart:  S1/S2 no murmur, no rub, gallop or click PMI normal Abdomen: benighn, BS positve, no tenderness, no AAA ventral hernia  Rectus diastasis no bruit.  No HSM or HJR Distal pulses intact with no bruits Plus one edema Neuro non-focal Skin warm and dry No muscular weakness    Current Outpatient Medications  Medication Sig Dispense Refill   alfuzosin (UROXATRAL) 10 MG 24 hr tablet Take 10 mg by mouth daily.     amLODipine (NORVASC) 5 MG tablet TAKE 1 TABLET BY MOUTH DAILY 90 tablet 2   furosemide (LASIX) 20 MG tablet TAKE 1 TABET BY MOUTH EVERY DAY 90 tablet 1   losartan (COZAAR) 100 MG tablet TAKE 1 TABLET BY MOUTH DAILY 90 tablet 2   pantoprazole (PROTONIX) 40 MG tablet TAKE 1 TABLET BY MOUTH DAILY 90 tablet 1   No current facility-administered medications for this visit.    Allergies  Patient has no known  allergies.  Electrocardiogram:  10/28/18 NSR normal ECG 11/26/2021 NSR rate 68 normal  Assessment and Plan  HTN:  Continue diuretic and ARB Norvasc started 08/2020 Check BMET Edema: improved with diuretic -> dependant   GERD:  Continue proton pump inhibiter and low carb diet GI:  Rectus diastasis stable no need for surgery better with weight loss Post GB removal with no biliary cholic   Labs including cholesterol F/U in 6 months   [name]

## 2021-11-26 ENCOUNTER — Encounter: Payer: Self-pay | Admitting: Cardiovascular Disease

## 2021-11-26 ENCOUNTER — Ambulatory Visit (INDEPENDENT_AMBULATORY_CARE_PROVIDER_SITE_OTHER): Payer: Medicare Other | Admitting: Cardiovascular Disease

## 2021-11-26 ENCOUNTER — Other Ambulatory Visit: Payer: Self-pay

## 2021-11-26 VITALS — BP 122/76 | HR 68 | Ht 68.0 in | Wt 226.0 lb

## 2021-11-26 DIAGNOSIS — I10 Essential (primary) hypertension: Secondary | ICD-10-CM | POA: Diagnosis not present

## 2021-11-26 DIAGNOSIS — R609 Edema, unspecified: Secondary | ICD-10-CM

## 2021-11-26 DIAGNOSIS — E782 Mixed hyperlipidemia: Secondary | ICD-10-CM | POA: Diagnosis not present

## 2021-11-26 MED ORDER — AMLODIPINE BESYLATE 5 MG PO TABS: 5.0000 mg | ORAL_TABLET | Freq: Every day | ORAL | 3 refills | Status: DC

## 2021-11-26 MED ORDER — FUROSEMIDE 20 MG PO TABS: 20.0000 mg | ORAL_TABLET | Freq: Every day | ORAL | 3 refills | Status: DC

## 2021-11-26 MED ORDER — LOSARTAN POTASSIUM 100 MG PO TABS: 100.0000 mg | ORAL_TABLET | Freq: Every day | ORAL | 3 refills | Status: DC

## 2021-11-26 NOTE — Patient Instructions (Signed)
Medication Instructions:  *If you need a refill on your cardiac medications before your next appointment, please call your pharmacy*  Lab Work: Your physician recommends that you return for lab work in: 12/10/20 CBC, Lipid Panel, and CMET  If you have labs (blood work) drawn today and your tests are completely normal, you will receive your results only by: [place] (if you have [place]) OR A paper copy in the mail If you have any lab test that is abnormal or we need to change your treatment, we will call you to review the results.  Follow-Up: At [facility], you and your health needs are our priority.  As part of our continuing mission to provide you with exceptional heart care, we have created designated Provider Care Teams.  These Care Teams include your primary Cardiologist (physician) and Advanced Practice Providers (APPs -  Physician Assistants and Nurse Practitioners) who all work together to provide you with the care you need, when you need it.  We recommend signing up for the patient portal called "MyChart".  Sign up information is provided on this After Visit Summary.  MyChart is used to connect with patients for Virtual Visits (Telemedicine).  Patients are able to view lab/test results, encounter notes, upcoming appointments, etc.  Non-urgent messages can be sent to your provider as well.   To learn more about what you can do with MyChart, go to [email or web address].    Your next appointment:   6 month(s)  The format for your next appointment:   In Person  Provider:   [name], MD {

## 2021-12-08 DIAGNOSIS — E291 Testicular hypofunction: Secondary | ICD-10-CM | POA: Diagnosis not present

## 2021-12-10 ENCOUNTER — Other Ambulatory Visit: Payer: Self-pay

## 2021-12-10 ENCOUNTER — Other Ambulatory Visit: Payer: Medicare Other | Admitting: *Deleted

## 2021-12-10 DIAGNOSIS — H43813 Vitreous degeneration, bilateral: Secondary | ICD-10-CM | POA: Diagnosis not present

## 2021-12-10 DIAGNOSIS — R609 Edema, unspecified: Secondary | ICD-10-CM | POA: Diagnosis not present

## 2021-12-10 DIAGNOSIS — E782 Mixed hyperlipidemia: Secondary | ICD-10-CM | POA: Diagnosis not present

## 2021-12-10 DIAGNOSIS — I10 Essential (primary) hypertension: Secondary | ICD-10-CM | POA: Diagnosis not present

## 2021-12-10 DIAGNOSIS — H2513 Age-related nuclear cataract, bilateral: Secondary | ICD-10-CM | POA: Diagnosis not present

## 2021-12-10 DIAGNOSIS — H2511 Age-related nuclear cataract, right eye: Secondary | ICD-10-CM | POA: Diagnosis not present

## 2021-12-10 DIAGNOSIS — H25043 Posterior subcapsular polar age-related cataract, bilateral: Secondary | ICD-10-CM | POA: Diagnosis not present

## 2021-12-10 DIAGNOSIS — H18413 Arcus senilis, bilateral: Secondary | ICD-10-CM | POA: Diagnosis not present

## 2021-12-10 DIAGNOSIS — H25013 Cortical age-related cataract, bilateral: Secondary | ICD-10-CM | POA: Diagnosis not present

## 2021-12-10 LAB — LIPID PANEL
Chol/HDL Ratio: 4.2 ratio (ref 0.0–5.0)
Cholesterol, Total: 142 mg/dL (ref 100–199)
HDL: 34 mg/dL — ABNORMAL LOW (ref >39)
LDL Chol Calc (NIH): 93 mg/dL (ref 0–99)
Triglycerides: 73 mg/dL (ref 0–149)
VLDL Cholesterol Cal: 15 mg/dL (ref 5–40)

## 2021-12-10 LAB — CBC WITH DIFFERENTIAL/PLATELET
Basophils Absolute: 0.1 10*3/uL (ref 0.0–0.2)
Basos: 1 %
EOS (ABSOLUTE): 0.2 10*3/uL (ref 0.0–0.4)
Eos: 3 %
Hematocrit: 43.3 % (ref 37.5–51.0)
Hemoglobin: 14.9 g/dL (ref 13.0–17.7)
Immature Grans (Abs): 0 10*3/uL (ref 0.0–0.1)
Immature Granulocytes: 0 %
Lymphocytes Absolute: 2.2 10*3/uL (ref 0.7–3.1)
Lymphs: 28 %
MCH: 32.7 pg (ref 26.6–33.0)
MCHC: 34.4 g/dL (ref 31.5–35.7)
MCV: 95 fL (ref 79–97)
Monocytes Absolute: 0.5 10*3/uL (ref 0.1–0.9)
Monocytes: 6 %
Neutrophils Absolute: 4.8 10*3/uL (ref 1.4–7.0)
Neutrophils: 62 %
Platelets: 198 10*3/uL (ref 150–450)
RBC: 4.55 x10E6/uL (ref 4.14–5.80)
RDW: 12 % (ref 11.6–15.4)
WBC: 7.8 10*3/uL (ref 3.4–10.8)

## 2021-12-10 LAB — COMPREHENSIVE METABOLIC PANEL
ALT: 14 IU/L (ref 0–44)
AST: 14 IU/L (ref 0–40)
Albumin/Globulin Ratio: 2.7 — ABNORMAL HIGH (ref 1.2–2.2)
Albumin: 4 g/dL (ref 3.8–4.8)
Alkaline Phosphatase: 49 IU/L (ref 44–121)
BUN/Creatinine Ratio: 11 (ref 10–24)
BUN: 12 mg/dL (ref 8–27)
Bilirubin Total: 0.6 mg/dL (ref 0.0–1.2)
CO2: 26 mmol/L (ref 20–29)
Calcium: 9.4 mg/dL (ref 8.6–10.2)
Chloride: 104 mmol/L (ref 96–106)
Creatinine, Ser: 1.07 mg/dL (ref 0.76–1.27)
Globulin, Total: 1.5 g/dL (ref 1.5–4.5)
Glucose: 98 mg/dL (ref 70–99)
Potassium: 4 mmol/L (ref 3.5–5.2)
Sodium: 140 mmol/L (ref 134–144)
Total Protein: 5.5 g/dL — ABNORMAL LOW (ref 6.0–8.5)
eGFR: 75 mL/min/{1.73_m2} (ref >59)

## 2021-12-12 ENCOUNTER — Telehealth: Payer: Self-pay

## 2021-12-12 DIAGNOSIS — E782 Mixed hyperlipidemia: Secondary | ICD-10-CM

## 2021-12-12 NOTE — Telephone Encounter (Signed)
Patient aware of results. Will place order for CT calcium score.

## 2021-12-12 NOTE — Telephone Encounter (Signed)
-----   Message from Josue Hector, MD sent at 12/11/2021  7:32 AM EST ----- LDL 93 no vascular dx ok can consider calcium score if he wants to further risk stratify

## 2021-12-15 DIAGNOSIS — E291 Testicular hypofunction: Secondary | ICD-10-CM | POA: Diagnosis not present

## 2021-12-15 DIAGNOSIS — N5201 Erectile dysfunction due to arterial insufficiency: Secondary | ICD-10-CM | POA: Diagnosis not present

## 2022-01-27 DIAGNOSIS — H2512 Age-related nuclear cataract, left eye: Secondary | ICD-10-CM | POA: Diagnosis not present

## 2022-01-27 DIAGNOSIS — H2511 Age-related nuclear cataract, right eye: Secondary | ICD-10-CM | POA: Diagnosis not present

## 2022-01-30 ENCOUNTER — Encounter: Payer: Self-pay | Admitting: Cardiovascular Disease

## 2022-01-30 ENCOUNTER — Other Ambulatory Visit: Payer: Self-pay

## 2022-01-30 ENCOUNTER — Ambulatory Visit (INDEPENDENT_AMBULATORY_CARE_PROVIDER_SITE_OTHER)
Admission: RE | Admit: 2022-01-30 | Discharge: 2022-01-30 | Disposition: A | Discharge status: Home / Self Care | Payer: Self-pay | Source: Ambulatory Visit | Attending: Cardiovascular Disease | Admitting: Cardiovascular Disease

## 2022-01-30 DIAGNOSIS — E782 Mixed hyperlipidemia: Secondary | ICD-10-CM

## 2022-01-30 DIAGNOSIS — R931 Abnormal findings on diagnostic imaging of heart and coronary circulation: Secondary | ICD-10-CM

## 2022-02-03 DIAGNOSIS — H2512 Age-related nuclear cataract, left eye: Secondary | ICD-10-CM | POA: Diagnosis not present

## 2022-02-03 DIAGNOSIS — H2513 Age-related nuclear cataract, bilateral: Secondary | ICD-10-CM | POA: Diagnosis not present

## 2022-02-03 DIAGNOSIS — H524 Presbyopia: Secondary | ICD-10-CM | POA: Diagnosis not present

## 2022-02-03 DIAGNOSIS — H18413 Arcus senilis, bilateral: Secondary | ICD-10-CM | POA: Diagnosis not present

## 2022-02-05 ENCOUNTER — Telehealth: Payer: Self-pay | Admitting: Cardiovascular Disease

## 2022-02-05 MED ORDER — ATORVASTATIN CALCIUM 10 MG PO TABS: 10.0000 mg | ORAL_TABLET | Freq: Every day | ORAL | 3 refills | Status: DC

## 2022-02-05 NOTE — Telephone Encounter (Signed)
Tried to call patient with his results and left message for him to call back. Also sent my chart message of the results. Placed orders for labs and lipitor.  ?

## 2022-02-05 NOTE — Telephone Encounter (Signed)
Patient returning call in regards to mychart message.  

## 2022-02-05 NOTE — Telephone Encounter (Signed)
Called patient back with his CT results. Patient will come back on 05/19/22 for lab work. ?

## 2022-02-20 DIAGNOSIS — L711 Rhinophyma: Secondary | ICD-10-CM | POA: Diagnosis not present

## 2022-02-20 DIAGNOSIS — L820 Inflamed seborrheic keratosis: Secondary | ICD-10-CM | POA: Diagnosis not present

## 2022-02-20 DIAGNOSIS — D1801 Hemangioma of skin and subcutaneous tissue: Secondary | ICD-10-CM | POA: Diagnosis not present

## 2022-02-20 DIAGNOSIS — L821 Other seborrheic keratosis: Secondary | ICD-10-CM | POA: Diagnosis not present

## 2022-03-10 DIAGNOSIS — M19042 Primary osteoarthritis, left hand: Secondary | ICD-10-CM | POA: Diagnosis not present

## 2022-03-10 DIAGNOSIS — M659 Synovitis and tenosynovitis, unspecified: Secondary | ICD-10-CM | POA: Diagnosis not present

## 2022-03-28 ENCOUNTER — Other Ambulatory Visit: Payer: Self-pay | Admitting: Cardiovascular Disease

## 2022-04-11 ENCOUNTER — Other Ambulatory Visit: Payer: Self-pay | Admitting: Cardiovascular Disease

## 2022-04-11 DIAGNOSIS — J0100 Acute maxillary sinusitis, unspecified: Secondary | ICD-10-CM | POA: Diagnosis not present

## 2022-04-11 DIAGNOSIS — J309 Allergic rhinitis, unspecified: Secondary | ICD-10-CM | POA: Diagnosis not present

## 2022-05-04 ENCOUNTER — Other Ambulatory Visit: Payer: Self-pay | Admitting: Cardiovascular Disease

## 2022-05-19 ENCOUNTER — Other Ambulatory Visit: Payer: Medicare Other

## 2022-05-19 DIAGNOSIS — R931 Abnormal findings on diagnostic imaging of heart and coronary circulation: Secondary | ICD-10-CM

## 2022-05-19 DIAGNOSIS — E782 Mixed hyperlipidemia: Secondary | ICD-10-CM

## 2022-05-20 LAB — LIPID PANEL
Chol/HDL Ratio: 3.4 ratio (ref 0.0–5.0)
Cholesterol, Total: 104 mg/dL (ref 100–199)
HDL: 31 mg/dL — ABNORMAL LOW (ref >39)
LDL Chol Calc (NIH): 58 mg/dL (ref 0–99)
Triglycerides: 73 mg/dL (ref 0–149)
VLDL Cholesterol Cal: 15 mg/dL (ref 5–40)

## 2022-05-20 LAB — HEPATIC FUNCTION PANEL
ALT: 19 IU/L (ref 0–44)
AST: 16 IU/L (ref 0–40)
Albumin: 4.1 g/dL (ref 3.9–4.9)
Alkaline Phosphatase: 51 IU/L (ref 44–121)
Bilirubin Total: 0.5 mg/dL (ref 0.0–1.2)
Bilirubin, Direct: 0.2 mg/dL (ref 0.00–0.40)
Total Protein: 5.6 g/dL — ABNORMAL LOW (ref 6.0–8.5)

## 2022-05-22 ENCOUNTER — Telehealth: Payer: Self-pay

## 2022-05-22 DIAGNOSIS — E782 Mixed hyperlipidemia: Secondary | ICD-10-CM

## 2022-05-22 DIAGNOSIS — I10 Essential (primary) hypertension: Secondary | ICD-10-CM

## 2022-05-22 NOTE — Telephone Encounter (Signed)
-----   Message from Josue Hector, MD sent at 05/20/2022  8:41 AM EDT ----- Cholesterol is at goal and LFT's are normal F/U labs in 6 months

## 2022-05-22 NOTE — Telephone Encounter (Signed)
Labs ordered for 6 months

## 2022-06-03 DIAGNOSIS — R109 Unspecified abdominal pain: Secondary | ICD-10-CM | POA: Diagnosis not present

## 2022-06-16 DIAGNOSIS — N200 Calculus of kidney: Secondary | ICD-10-CM | POA: Diagnosis not present

## 2022-06-16 DIAGNOSIS — K760 Fatty (change of) liver, not elsewhere classified: Secondary | ICD-10-CM

## 2022-06-16 HISTORY — DX: Fatty (change of) liver, not elsewhere classified: K76.0

## 2022-06-25 DIAGNOSIS — E291 Testicular hypofunction: Secondary | ICD-10-CM | POA: Diagnosis not present

## 2022-06-25 DIAGNOSIS — R948 Abnormal results of function studies of other organs and systems: Secondary | ICD-10-CM | POA: Diagnosis not present

## 2022-06-26 DIAGNOSIS — R109 Unspecified abdominal pain: Secondary | ICD-10-CM | POA: Diagnosis not present

## 2022-07-02 ENCOUNTER — Other Ambulatory Visit (HOSPITAL_COMMUNITY): Payer: Self-pay

## 2022-07-02 DIAGNOSIS — E291 Testicular hypofunction: Secondary | ICD-10-CM | POA: Diagnosis not present

## 2022-07-02 DIAGNOSIS — N401 Enlarged prostate with lower urinary tract symptoms: Secondary | ICD-10-CM | POA: Diagnosis not present

## 2022-07-02 DIAGNOSIS — N200 Calculus of kidney: Secondary | ICD-10-CM | POA: Diagnosis not present

## 2022-07-02 DIAGNOSIS — R351 Nocturia: Secondary | ICD-10-CM | POA: Diagnosis not present

## 2022-07-02 DIAGNOSIS — N5201 Erectile dysfunction due to arterial insufficiency: Secondary | ICD-10-CM | POA: Diagnosis not present

## 2022-07-02 MED ORDER — CLOMIPHENE CITRATE 50 MG PO TABS
ORAL_TABLET | ORAL | 5 refills | Status: DC
  Filled 2022-07-02: qty 30, 60d supply, fill #0

## 2022-07-03 DIAGNOSIS — K2901 Acute gastritis with bleeding: Secondary | ICD-10-CM | POA: Diagnosis not present

## 2022-07-03 DIAGNOSIS — K2101 Gastro-esophageal reflux disease with esophagitis, with bleeding: Secondary | ICD-10-CM | POA: Diagnosis not present

## 2022-07-03 DIAGNOSIS — K2210 Ulcer of esophagus without bleeding: Secondary | ICD-10-CM | POA: Diagnosis not present

## 2022-07-03 DIAGNOSIS — Z791 Long term (current) use of non-steroidal anti-inflammatories (NSAID): Secondary | ICD-10-CM | POA: Diagnosis not present

## 2022-07-03 DIAGNOSIS — K2951 Unspecified chronic gastritis with bleeding: Secondary | ICD-10-CM | POA: Diagnosis not present

## 2022-07-15 ENCOUNTER — Other Ambulatory Visit: Payer: Self-pay | Admitting: Urology

## 2022-07-16 ENCOUNTER — Encounter (HOSPITAL_BASED_OUTPATIENT_CLINIC_OR_DEPARTMENT_OTHER): Payer: Self-pay | Admitting: Urology

## 2022-07-16 NOTE — H&P (Signed)
Office Visit Report     07/02/2022   --------------------------------------------------------------------------------   Barry Mcdonald  MRN: [id number]  DOB: 1951/01/06, 71 year old Male  SSN: [id number]   PRIMARY CARE:    REFERRING:  Christopher A. Liliane Shi, MD  PROVIDER:  Rhoderick Moody, M.D.  LOCATION:  [facility] [phone]     --------------------------------------------------------------------------------   CC/HPI: CC: Prostate check   HPI: Barry Mcdonald is a 71 year old male with a history of BPH, hypogonadism and ED.   Last PSA: 0.1 (06/2022), 0.098 (07/2020), 0.131 (07/2019), 0.14 (07/2018), 0.27 (07/2017)-- stable   08/03/19: Barry Mcdonald is here today for routine annual follow-up. At his last visit, the patient was reported issues with nocturia x3-4 and was encouraged to monitor his fluid intake and elevate his lower extremities prior to bedtime. Today, he reports stable LUTS. Nocturia x 3-4--he admits to drinking alcohol and caffeine in the evening along with hydrating after he works out. Denies interval UTIs, dysuria or hematuria.   -Adequate erections with sildenafil 20 mg w/o associated side effects.   08/02/20: The patient is here today for a routine follow-up. He reports stable LUTS since his last OV with nocturia x 3-4, which is he not overly bothered by. He states that he snores throughout the night, but has never been evaluated for OSA. Denies interval UTIs, dysuria or hematuria. He admits that he sporadically uses sildenafil, but it continues provide him with good erectile quality.   09/01/21: The patient is here today for routine follow-up. He continues to take alfuzosin and reports stable LUTS. Denies interval UTIs, dysuria or hematuria. Urgency and frequency have improved since reducing his coffee intake. Nocturia x 3-5, which is baseline for him. He is sporadically using sildenafil with adequate erectile quality. He remains very active and  exercises daily.   12/15/21: The patient is here today for routine follow-up. He is currently on Clomid 50 mg once daily and reports excellent improvement in his energy and libido. He denies any associated side effects. Seeing only marginal improvement in his testosterone levels.   07/02/22: The patient is here today for a routine follow-up. He is currently on Clomid 50 mg every other day and notes sustained improvement in his energy and libido. He continues to get good erections with sildenafil as needed. He continues to take alfuzosin and reports stable LUTS. Denies interval UTIs, dysuria or hematuria. He reports that he was noted to have a left renal stone on recent abdominal US. He denies interval episodes of flank pain or stone passage.     ALLERGIES: Antihistamine TABS    MEDICATIONS: Alfuzosin Hcl Er 10 mg tablet, extended release 24 hr 1 tablet PO Daily  Clomid 50 mg tablet 1 tablet PO Daily  Losartan Potassium-HCTZ 100-12.5 MG Oral Tablet Oral  Pantoprazole Sodium 40 MG Oral Tablet Delayed Release Oral  Pravastatin Sodium 40 mg tablet Oral  Sildenafil Citrate 20 mg tablet 1 tablet PO Daily PRN     GU PSH: None     PSH Notes: Cath Laser Angioplasty, Kidney Surgery   NON-GU PSH: Deviated Septum Surgery - 2017 Hand/finger Surgery, BASIL ARTHRITIS SURGERY ON THUMB - 2019     GU PMH: ED due to arterial insufficiency - 12/15/2021, - 09/01/2021, - 08/02/2020, Erectile dysfunction due to arterial insufficiency, - 2016 Primary hypogonadism - 12/15/2021 BPH w/LUTS - 09/01/2021, - 08/02/2020, - 2018, - 2017, Benign prostatic hyperplasia with urinary obstruction, - 2016 Encounter for Prostate Cancer screening - 09/01/2021 Nocturia -  09/01/2021, - 08/02/2020, 05-Oct-2016 Urinary Frequency - 05-Oct-2016 Weak Urinary Stream, Weak urinary stream - October 06, 2015 Renal calculus, Nephrolithiasis - 2014/10/05      PMH Notes:  2012-07-14 08:41:17 - Note: Arthritis   NON-GU PMH: Encounter for general adult medical examination  without abnormal findings, Encounter for preventive health examination - 10-06-2015 Personal history of other diseases of the circulatory system, History of hypertension - 10/05/2013 Personal history of other specified conditions, History of heartburn - Oct 05, 2013    FAMILY HISTORY: Death In The Family Father - Father Family Health Status - Mother's Age - Mother Family Health Status Number - Runs In Family Heart Disease - Father   SOCIAL HISTORY: Marital Status: Single Preferred Language: English; Race: White Current Smoking Status: Patient does not smoke anymore. Has not smoked since 07/20/1984.  Social Drinker.  Drinks 1 caffeinated drink per day. Patient's occupation is/was Retired.    REVIEW OF SYSTEMS:    GU Review Male:   Patient denies frequent urination, hard to postpone urination, burning/ pain with urination, get up at night to urinate, leakage of urine, stream starts and stops, trouble starting your stream, have to strain to urinate , erection problems, and penile pain.  Gastrointestinal (Upper):   Patient denies nausea, vomiting, and indigestion/ heartburn.  Gastrointestinal (Lower):   Patient denies diarrhea and constipation.  Constitutional:   Patient denies fever, night sweats, weight loss, and fatigue.  Skin:   Patient denies skin rash/ lesion and itching.  Eyes:   Patient denies blurred vision and double vision.  Ears/ Nose/ Throat:   Patient denies sore throat and sinus problems.  Hematologic/Lymphatic:   Patient denies swollen glands and easy bruising.  Cardiovascular:   Patient denies leg swelling and chest pains.  Respiratory:   Patient denies cough and shortness of breath.  Endocrine:   Patient denies excessive thirst.  Musculoskeletal:   Patient denies back pain and joint pain.  Neurological:   Patient denies headaches and dizziness.  Psychologic:   Patient denies depression and anxiety.   VITAL SIGNS:      07/02/2022 09:32 AM  BP 171/92 mmHg  Pulse 67 /min   MULTI-SYSTEM  PHYSICAL EXAMINATION:    Constitutional: Well-nourished. No physical deformities. Normally developed. Good grooming.  Neurologic / Psychiatric: Oriented to time, oriented to place, oriented to person. No depression, no anxiety, no agitation.  Musculoskeletal: Normal gait and station of head and neck.     Complexity of Data:  Lab Test Review:   PSA, Hemoglobin and Hematocrit, Total Testosterone  Records Review:   Previous Patient Records   06/25/22 09/01/21 08/02/20 08/07/19 07/14/18 07/21/17 07/20/16 08/09/15  PSA  Total PSA 0.10 ng/mL 0.17 ng/mL 0.098 ng/mL 0.131 ng/mL 0.14 ng/mL 0.27 ng/mL 0.38 ng/dl 1.61     09/60/45 40/98/11 09/01/21  Hormones  Testosterone, Total 363.5 ng/dL 914.7 ng/dL 829.5 ng/dL    PROCEDURES:         KUB - 74018  A single view of the abdomen is obtained.      Patient confirmed No Neulasta OnPro Device.   1 cm calcification within the lower portion of the left renal shadow. No urinary calculi seen within right renal shadow or along the expected course of either ureter. No calcifications noted within bladder. No boney abnormalities. No abnormal bowel/gas patterns or signs of free air.          Urinalysis - 81003 Dipstick Dipstick Cont'd  Specimen: Voided Bilirubin: Neg  Color: Yellow Ketones: Neg  Appearance: Clear Blood: Neg  Specific Gravity: 1.020 Protein: Neg  pH: 7.0 Urobilinogen: 0.2  Glucose: Neg Nitrites: Neg    Leukocyte Esterase: Neg         Urinalysis - 81003 Dipstick Dipstick Cont'd  Color: Yellow Bilirubin: Neg mg/dL  Appearance: Clear Ketones: Neg mg/dL  Specific Gravity: 1.308 Blood: Neg ery/uL  pH: 7.0 Protein: Neg mg/dL  Glucose: Neg mg/dL Urobilinogen: 0.2 mg/dL    Nitrites: Neg    Leukocyte Esterase: Neg leu/uL    ASSESSMENT:      ICD-10 Details  1 GU:   BPH w/LUTS - N40.1 Chronic, Stable  2   Nocturia - R35.1 Chronic, Stable  3   ED due to arterial insufficiency - N52.01 Chronic, Stable  4   Primary hypogonadism -  E29.1 Chronic, Stable  5   Renal calculus - N20.0 Left, Undiagnosed New Problem   PLAN:           Orders         Schedule Labs: 6 Months - Hemoglobin and Hematocrit    6 Months - Total Testosterone  X-Rays: 1 Year - KUB  Return Visit/Planned Activity: 6 Months - Office Visit, Extender             Note: Cancel OV and labs schedule for 08/2022  Return Visit/Planned Activity: Next Available Appointment - Schedule Surgery          Document Letter(s):  Created for Patient: Clinical Summary         Notes:   -KUB today shows a 1 cm left lower pole stone.  -The risks, benefits and alternatives of LEFT ESWL was discussed with the patient. I described the risks which include arrhythmia, kidney contusion, kidney hemorrhage, need for transfusion, back discomfort, flank ecchymosis, flank abrasion, inability to break up stone, inability to pass stone fragments, Steinstrasse, infection associated with obstructing stones, need for different surgical procedure and possible need for repeat shockwave lithotripsy. The patient voices understanding and wishes to proceed.  -Continue alfuzosin once daily.  -Continue Clomid 50 mg once daily  -Continue sildenafil PRN  -Repeat labs in 6 months     * Signed by Rhoderick Moody, M.D. on 07/02/22 at 2:29 PM (EDT)*      The information contained in this medical record document is considered private and confidential patient information. This information can only be used for the medical diagnosis and/or medical services that are being provided by the patient's selected caregivers. This information can only be distributed outside of the patient's care if the patient agrees and signs waivers of authorization for this information to be sent to an outside source or route.

## 2022-07-17 NOTE — Progress Notes (Signed)
Pre-op phone call complete. Procedure date and time confirmed. Patient allergies, medications, and medical history verified. Patient advised not to take lasix the day of procedure. Patient told to take blood pressure medications the day of procedure. Driver secured

## 2022-07-20 ENCOUNTER — Encounter (HOSPITAL_BASED_OUTPATIENT_CLINIC_OR_DEPARTMENT_OTHER)
Admission: RE | Disposition: A | Discharge status: Home / Self Care | Payer: Self-pay | Source: Home / Self Care | Attending: Urology

## 2022-07-20 ENCOUNTER — Other Ambulatory Visit: Payer: Self-pay

## 2022-07-20 ENCOUNTER — Ambulatory Visit (HOSPITAL_COMMUNITY): Payer: Medicare Other

## 2022-07-20 ENCOUNTER — Ambulatory Visit (HOSPITAL_BASED_OUTPATIENT_CLINIC_OR_DEPARTMENT_OTHER)
Admission: RE | Admit: 2022-07-20 | Discharge: 2022-07-20 | Disposition: A | Discharge status: Home / Self Care | Payer: Medicare Other | Attending: Urology | Admitting: Urology

## 2022-07-20 ENCOUNTER — Encounter (HOSPITAL_BASED_OUTPATIENT_CLINIC_OR_DEPARTMENT_OTHER): Payer: Self-pay | Admitting: Urology

## 2022-07-20 DIAGNOSIS — R351 Nocturia: Secondary | ICD-10-CM | POA: Diagnosis not present

## 2022-07-20 DIAGNOSIS — Z01818 Encounter for other preprocedural examination: Secondary | ICD-10-CM | POA: Diagnosis not present

## 2022-07-20 DIAGNOSIS — N5201 Erectile dysfunction due to arterial insufficiency: Secondary | ICD-10-CM | POA: Diagnosis not present

## 2022-07-20 DIAGNOSIS — N200 Calculus of kidney: Secondary | ICD-10-CM | POA: Diagnosis not present

## 2022-07-20 DIAGNOSIS — I878 Other specified disorders of veins: Secondary | ICD-10-CM | POA: Diagnosis not present

## 2022-07-20 DIAGNOSIS — Z8249 Family history of ischemic heart disease and other diseases of the circulatory system: Secondary | ICD-10-CM | POA: Insufficient documentation

## 2022-07-20 DIAGNOSIS — E669 Obesity, unspecified: Secondary | ICD-10-CM | POA: Insufficient documentation

## 2022-07-20 DIAGNOSIS — Z79899 Other long term (current) drug therapy: Secondary | ICD-10-CM | POA: Insufficient documentation

## 2022-07-20 DIAGNOSIS — Z6830 Body mass index (BMI) 30.0-30.9, adult: Secondary | ICD-10-CM | POA: Insufficient documentation

## 2022-07-20 DIAGNOSIS — N401 Enlarged prostate with lower urinary tract symptoms: Secondary | ICD-10-CM | POA: Insufficient documentation

## 2022-07-20 DIAGNOSIS — E291 Testicular hypofunction: Secondary | ICD-10-CM | POA: Diagnosis not present

## 2022-07-20 DIAGNOSIS — I10 Essential (primary) hypertension: Secondary | ICD-10-CM | POA: Insufficient documentation

## 2022-07-20 HISTORY — PX: EXTRACORPOREAL SHOCK WAVE LITHOTRIPSY: SHX1557

## 2022-07-20 SURGERY — LITHOTRIPSY, ESWL
Anesthesia: LOCAL | Laterality: Left

## 2022-07-20 MED ORDER — HYDROCODONE-ACETAMINOPHEN 7.5-325 MG PO TABS: 1.0000 | ORAL_TABLET | Freq: Four times a day (QID) | ORAL | 0 refills | Status: DC | PRN

## 2022-07-20 MED ORDER — SODIUM CHLORIDE 0.9 % IV SOLN: INTRAVENOUS | Status: DC

## 2022-07-20 MED ORDER — DIAZEPAM 5 MG PO TABS
10.0000 mg | ORAL_TABLET | ORAL | Status: AC
  Administered 2022-07-20: 10 mg via ORAL

## 2022-07-20 MED ORDER — CIPROFLOXACIN HCL 500 MG PO TABS
500.0000 mg | ORAL_TABLET | ORAL | Status: AC
  Administered 2022-07-20: 500 mg via ORAL

## 2022-07-20 MED ORDER — DIPHENHYDRAMINE HCL 25 MG PO CAPS
ORAL_CAPSULE | ORAL | Status: AC
  Filled 2022-07-20: qty 1

## 2022-07-20 MED ORDER — CIPROFLOXACIN HCL 500 MG PO TABS
ORAL_TABLET | ORAL | Status: AC
  Filled 2022-07-20: qty 1

## 2022-07-20 MED ORDER — DIPHENHYDRAMINE HCL 25 MG PO CAPS
25.0000 mg | ORAL_CAPSULE | ORAL | Status: AC
  Administered 2022-07-20: 25 mg via ORAL

## 2022-07-20 MED ORDER — DIAZEPAM 5 MG PO TABS
ORAL_TABLET | ORAL | Status: AC
  Filled 2022-07-20: qty 2

## 2022-07-20 NOTE — Op Note (Signed)
Left LP Renal Stone   LEFT ESWL   Findings: Left LP stone required slower rate to 60 to target (respiratory excursion). He did well and the stone faded but he may need a staged procedure if he fails to pass the stones or stone fragments.

## 2022-07-20 NOTE — Discharge Instructions (Signed)

## 2022-07-20 NOTE — Interval H&P Note (Signed)
History and Physical Interval Note:  07/20/2022 11:20 AM  Barry Mcdonald  has presented today for surgery, with the diagnosis of LEFT RENAL STONE.  The various methods of treatment have been discussed with the patient and family. After consideration of risks, benefits and other options for treatment, the patient has consented to  Procedure(s): EXTRACORPOREAL SHOCK WAVE LITHOTRIPSY (ESWL) (Left) as a surgical intervention.  The patient's history has been reviewed, patient examined, no change in status, stable for surgery.  I have reviewed the patient's chart and labs.  Questions were answered to the patient's satisfaction.  He is well with no cough, congestion, fever, dysuria or gross hematuria.    Festus Aloe

## 2022-07-21 ENCOUNTER — Encounter (HOSPITAL_BASED_OUTPATIENT_CLINIC_OR_DEPARTMENT_OTHER): Payer: Self-pay | Admitting: Urology

## 2022-08-13 DIAGNOSIS — K2210 Ulcer of esophagus without bleeding: Secondary | ICD-10-CM | POA: Diagnosis not present

## 2022-08-13 DIAGNOSIS — R109 Unspecified abdominal pain: Secondary | ICD-10-CM | POA: Diagnosis not present

## 2022-08-19 DIAGNOSIS — N200 Calculus of kidney: Secondary | ICD-10-CM | POA: Diagnosis not present

## 2022-08-19 DIAGNOSIS — J019 Acute sinusitis, unspecified: Secondary | ICD-10-CM | POA: Diagnosis not present

## 2022-08-19 DIAGNOSIS — R0981 Nasal congestion: Secondary | ICD-10-CM | POA: Diagnosis not present

## 2022-09-17 ENCOUNTER — Other Ambulatory Visit: Payer: Self-pay | Admitting: Cardiovascular Disease

## 2022-09-17 DIAGNOSIS — R351 Nocturia: Secondary | ICD-10-CM | POA: Diagnosis not present

## 2022-09-17 DIAGNOSIS — N200 Calculus of kidney: Secondary | ICD-10-CM | POA: Diagnosis not present

## 2022-09-18 NOTE — Telephone Encounter (Signed)
Patient is not taking omeprazole. He is on Protonix. Per Patient, he was switched back to Protonix by his GI doctor due to an ulcer he had on his esophagus. Will refuse to refill omeprazole.

## 2022-09-22 DIAGNOSIS — L03211 Cellulitis of face: Secondary | ICD-10-CM | POA: Diagnosis not present

## 2022-10-08 DIAGNOSIS — M7552 Bursitis of left shoulder: Secondary | ICD-10-CM | POA: Diagnosis not present

## 2022-10-08 DIAGNOSIS — M25512 Pain in left shoulder: Secondary | ICD-10-CM | POA: Diagnosis not present

## 2022-10-08 DIAGNOSIS — M75102 Unspecified rotator cuff tear or rupture of left shoulder, not specified as traumatic: Secondary | ICD-10-CM | POA: Diagnosis not present

## 2022-10-19 DIAGNOSIS — M75122 Complete rotator cuff tear or rupture of left shoulder, not specified as traumatic: Secondary | ICD-10-CM | POA: Diagnosis not present

## 2022-10-19 DIAGNOSIS — M25512 Pain in left shoulder: Secondary | ICD-10-CM | POA: Diagnosis not present

## 2022-10-21 DIAGNOSIS — M25512 Pain in left shoulder: Secondary | ICD-10-CM | POA: Diagnosis not present

## 2022-10-21 DIAGNOSIS — M75102 Unspecified rotator cuff tear or rupture of left shoulder, not specified as traumatic: Secondary | ICD-10-CM | POA: Diagnosis not present

## 2022-10-22 DIAGNOSIS — J019 Acute sinusitis, unspecified: Secondary | ICD-10-CM | POA: Diagnosis not present

## 2022-10-22 DIAGNOSIS — J040 Acute laryngitis: Secondary | ICD-10-CM | POA: Diagnosis not present

## 2022-10-28 DIAGNOSIS — J209 Acute bronchitis, unspecified: Secondary | ICD-10-CM | POA: Diagnosis not present

## 2022-11-13 DIAGNOSIS — Z87891 Personal history of nicotine dependence: Secondary | ICD-10-CM | POA: Diagnosis not present

## 2022-11-13 DIAGNOSIS — M7542 Impingement syndrome of left shoulder: Secondary | ICD-10-CM | POA: Diagnosis not present

## 2022-11-13 DIAGNOSIS — M75122 Complete rotator cuff tear or rupture of left shoulder, not specified as traumatic: Secondary | ICD-10-CM | POA: Diagnosis not present

## 2022-11-13 DIAGNOSIS — M75102 Unspecified rotator cuff tear or rupture of left shoulder, not specified as traumatic: Secondary | ICD-10-CM | POA: Diagnosis not present

## 2022-11-13 DIAGNOSIS — G8918 Other acute postprocedural pain: Secondary | ICD-10-CM | POA: Diagnosis not present

## 2022-11-13 DIAGNOSIS — M7552 Bursitis of left shoulder: Secondary | ICD-10-CM | POA: Diagnosis not present

## 2022-11-16 DIAGNOSIS — M75122 Complete rotator cuff tear or rupture of left shoulder, not specified as traumatic: Secondary | ICD-10-CM | POA: Diagnosis not present

## 2022-11-16 DIAGNOSIS — M25512 Pain in left shoulder: Secondary | ICD-10-CM | POA: Diagnosis not present

## 2022-11-19 DIAGNOSIS — M25512 Pain in left shoulder: Secondary | ICD-10-CM | POA: Diagnosis not present

## 2022-11-19 DIAGNOSIS — M75122 Complete rotator cuff tear or rupture of left shoulder, not specified as traumatic: Secondary | ICD-10-CM | POA: Diagnosis not present

## 2022-11-24 DIAGNOSIS — M75122 Complete rotator cuff tear or rupture of left shoulder, not specified as traumatic: Secondary | ICD-10-CM | POA: Diagnosis not present

## 2022-11-24 DIAGNOSIS — M25512 Pain in left shoulder: Secondary | ICD-10-CM | POA: Diagnosis not present

## 2022-11-27 DIAGNOSIS — M25512 Pain in left shoulder: Secondary | ICD-10-CM | POA: Diagnosis not present

## 2022-11-27 DIAGNOSIS — J310 Chronic rhinitis: Secondary | ICD-10-CM | POA: Diagnosis not present

## 2022-11-27 DIAGNOSIS — M75122 Complete rotator cuff tear or rupture of left shoulder, not specified as traumatic: Secondary | ICD-10-CM | POA: Diagnosis not present

## 2022-11-30 ENCOUNTER — Other Ambulatory Visit: Payer: Self-pay | Admitting: Cardiovascular Disease

## 2022-12-01 DIAGNOSIS — M25512 Pain in left shoulder: Secondary | ICD-10-CM | POA: Diagnosis not present

## 2022-12-01 DIAGNOSIS — M75122 Complete rotator cuff tear or rupture of left shoulder, not specified as traumatic: Secondary | ICD-10-CM | POA: Diagnosis not present

## 2022-12-04 DIAGNOSIS — M25512 Pain in left shoulder: Secondary | ICD-10-CM | POA: Diagnosis not present

## 2022-12-04 DIAGNOSIS — M75122 Complete rotator cuff tear or rupture of left shoulder, not specified as traumatic: Secondary | ICD-10-CM | POA: Diagnosis not present

## 2022-12-08 DIAGNOSIS — M25512 Pain in left shoulder: Secondary | ICD-10-CM | POA: Diagnosis not present

## 2022-12-08 DIAGNOSIS — M75122 Complete rotator cuff tear or rupture of left shoulder, not specified as traumatic: Secondary | ICD-10-CM | POA: Diagnosis not present

## 2022-12-08 NOTE — Progress Notes (Unsigned)
Patient ID: Barry Mcdonald, male   DOB: 03-23-1951, 72 y.o.   MRN: [id number]     72 y.o. f/u HTN and atypical chest pain. Cath 2013 normal cors. On diuretic,norvasc and ARB for HTN   More sedentary during [place] girlfriend Rx breast cancer lumpectomy/XRT 2020 He had his GB out 2020  Enjoys going to beach and [place] a lot   Had right/left Hallux medial nail avulsion 05/28/21   No cardiac issues Has some small dogs at home but doesn't walk them   Calcium score done 01/30/22 was 313 , 62 th percentile for age/sex  Majority of it in LAD Pn statin and ASA  LDL 58 on 05/19/22   ***  ROS: Denies fever, malais, weight loss, blurry vision, decreased visual acuity, cough, sputum, SOB, hemoptysis, pleuritic pain, palpitaitons, heartburn, abdominal pain, melena, lower extremity edema, claudication, or rash.  All other systems reviewed and negative  General: BP 130/76   Pulse 71   Ht '5\' 8"'$  (1.727 m)   Wt 212 lb (96.2 kg)   SpO2 99%   BMI 32.23 kg/m    Affect appropriate Overweight white male  HEENT: normal Neck supple with no adenopathy JVP normal no bruits no thyromegaly Lungs clear with no wheezing and good diaphragmatic motion Heart:  S1/S2 no murmur, no rub, gallop or click PMI normal Abdomen: benighn, BS positve, no tenderness, no AAA ventral hernia  Rectus diastasis no bruit.  No HSM or HJR Distal pulses intact with no bruits Plus one edema Neuro non-focal Skin warm and dry No muscular weakness    Current Outpatient Medications  Medication Sig Dispense Refill   alfuzosin (UROXATRAL) 10 MG 24 hr tablet Take 10 mg by mouth daily.     atorvastatin (LIPITOR) 10 MG tablet Take 1 tablet (10 mg total) by mouth daily. 90 tablet 3   furosemide (LASIX) 20 MG tablet TAKE 1 TABET BY MOUTH EVERY DAY 90 tablet 0   HYDROcodone-acetaminophen (NORCO) 7.5-325 MG tablet Take 1 tablet by mouth every 6 (six) hours as needed for moderate pain. 12 tablet 0   losartan (COZAAR) 100  MG tablet TAKE 1 TABLET BY MOUTH EVERY DAY 90 tablet 0   pantoprazole (PROTONIX) 40 MG tablet Take 40 mg by mouth daily.     No current facility-administered medications for this visit.    Allergies  Patient has no known allergies.  Electrocardiogram:  12/15/2022 SR rate 71 LVH no acute changes   Assessment and Plan  HTN:  Continue diuretic and ARB Norvasc started 08/2020   Edema: improved with diuretic -> dependant   GERD:  Continue proton pump inhibiter and low carb diet GI:  Rectus diastasis stable no need for surgery better with weight loss Post GB removal with no biliary cholic  HLD:  with above average calcium score Continue statin LdL at goal  CAD: sub clinical with high calcium score for age ASA/statin    F/U in 6 months   [name]

## 2022-12-10 DIAGNOSIS — M75122 Complete rotator cuff tear or rupture of left shoulder, not specified as traumatic: Secondary | ICD-10-CM | POA: Diagnosis not present

## 2022-12-10 DIAGNOSIS — M25512 Pain in left shoulder: Secondary | ICD-10-CM | POA: Diagnosis not present

## 2022-12-15 ENCOUNTER — Encounter: Payer: Self-pay | Admitting: Cardiovascular Disease

## 2022-12-15 ENCOUNTER — Ambulatory Visit: Payer: Medicare Other | Attending: Cardiovascular Disease | Admitting: Cardiovascular Disease

## 2022-12-15 VITALS — BP 130/76 | HR 71 | Ht 68.0 in | Wt 212.0 lb

## 2022-12-15 DIAGNOSIS — M75122 Complete rotator cuff tear or rupture of left shoulder, not specified as traumatic: Secondary | ICD-10-CM | POA: Diagnosis not present

## 2022-12-15 DIAGNOSIS — I10 Essential (primary) hypertension: Secondary | ICD-10-CM

## 2022-12-15 DIAGNOSIS — R609 Edema, unspecified: Secondary | ICD-10-CM | POA: Diagnosis not present

## 2022-12-15 DIAGNOSIS — E782 Mixed hyperlipidemia: Secondary | ICD-10-CM | POA: Diagnosis not present

## 2022-12-15 DIAGNOSIS — M25512 Pain in left shoulder: Secondary | ICD-10-CM | POA: Diagnosis not present

## 2022-12-15 DIAGNOSIS — R931 Abnormal findings on diagnostic imaging of heart and coronary circulation: Secondary | ICD-10-CM | POA: Diagnosis not present

## 2022-12-15 DIAGNOSIS — R600 Localized edema: Secondary | ICD-10-CM

## 2022-12-15 NOTE — Patient Instructions (Signed)
Medication Instructions:  Your physician recommends that you continue on your current medications as directed. Please refer to the Current Medication list given to you today.  *If you need a refill on your cardiac medications before your next appointment, please call your pharmacy*  Lab Work: If you have labs (blood work) drawn today and your tests are completely normal, you will receive your results only by: MyChart Message (if you have MyChart) OR A paper copy in the mail If you have any lab test that is abnormal or we need to change your treatment, we will call you to review the results.  Testing/Procedures: None ordered today.  Follow-Up: At [facility], you and your health needs are our priority.  As part of our continuing mission to provide you with exceptional heart care, we have created designated Provider Care Teams.  These Care Teams include your primary Cardiologist (physician) and Advanced Practice Providers (APPs -  Physician Assistants and Nurse Practitioners) who all work together to provide you with the care you need, when you need it.  We recommend signing up for the patient portal called "MyChart".  Sign up information is provided on this After Visit Summary.  MyChart is used to connect with patients for Virtual Visits (Telemedicine).  Patients are able to view lab/test results, encounter notes, upcoming appointments, etc.  Non-urgent messages can be sent to your provider as well.   To learn more about what you can do with MyChart, go to [email or web address].    Your next appointment:   6 month(s)  Provider:   Peter Nishan, MD      

## 2022-12-17 DIAGNOSIS — N5201 Erectile dysfunction due to arterial insufficiency: Secondary | ICD-10-CM | POA: Diagnosis not present

## 2022-12-17 DIAGNOSIS — N200 Calculus of kidney: Secondary | ICD-10-CM | POA: Diagnosis not present

## 2022-12-17 DIAGNOSIS — E291 Testicular hypofunction: Secondary | ICD-10-CM | POA: Diagnosis not present

## 2022-12-17 DIAGNOSIS — R351 Nocturia: Secondary | ICD-10-CM | POA: Diagnosis not present

## 2022-12-17 DIAGNOSIS — N401 Enlarged prostate with lower urinary tract symptoms: Secondary | ICD-10-CM | POA: Diagnosis not present

## 2022-12-22 DIAGNOSIS — M25512 Pain in left shoulder: Secondary | ICD-10-CM | POA: Diagnosis not present

## 2022-12-22 DIAGNOSIS — M75122 Complete rotator cuff tear or rupture of left shoulder, not specified as traumatic: Secondary | ICD-10-CM | POA: Diagnosis not present

## 2022-12-24 DIAGNOSIS — M75122 Complete rotator cuff tear or rupture of left shoulder, not specified as traumatic: Secondary | ICD-10-CM | POA: Diagnosis not present

## 2022-12-24 DIAGNOSIS — M25512 Pain in left shoulder: Secondary | ICD-10-CM | POA: Diagnosis not present

## 2022-12-29 DIAGNOSIS — M75122 Complete rotator cuff tear or rupture of left shoulder, not specified as traumatic: Secondary | ICD-10-CM | POA: Diagnosis not present

## 2022-12-29 DIAGNOSIS — M25512 Pain in left shoulder: Secondary | ICD-10-CM | POA: Diagnosis not present

## 2022-12-31 DIAGNOSIS — M75122 Complete rotator cuff tear or rupture of left shoulder, not specified as traumatic: Secondary | ICD-10-CM | POA: Diagnosis not present

## 2022-12-31 DIAGNOSIS — M25512 Pain in left shoulder: Secondary | ICD-10-CM | POA: Diagnosis not present

## 2023-01-05 DIAGNOSIS — M25512 Pain in left shoulder: Secondary | ICD-10-CM | POA: Diagnosis not present

## 2023-01-05 DIAGNOSIS — M75122 Complete rotator cuff tear or rupture of left shoulder, not specified as traumatic: Secondary | ICD-10-CM | POA: Diagnosis not present

## 2023-01-07 DIAGNOSIS — M75122 Complete rotator cuff tear or rupture of left shoulder, not specified as traumatic: Secondary | ICD-10-CM | POA: Diagnosis not present

## 2023-01-07 DIAGNOSIS — M25512 Pain in left shoulder: Secondary | ICD-10-CM | POA: Diagnosis not present

## 2023-01-11 DIAGNOSIS — M75122 Complete rotator cuff tear or rupture of left shoulder, not specified as traumatic: Secondary | ICD-10-CM | POA: Diagnosis not present

## 2023-01-11 DIAGNOSIS — M25512 Pain in left shoulder: Secondary | ICD-10-CM | POA: Diagnosis not present

## 2023-01-14 DIAGNOSIS — M75122 Complete rotator cuff tear or rupture of left shoulder, not specified as traumatic: Secondary | ICD-10-CM | POA: Diagnosis not present

## 2023-01-14 DIAGNOSIS — M25512 Pain in left shoulder: Secondary | ICD-10-CM | POA: Diagnosis not present

## 2023-01-19 DIAGNOSIS — M25512 Pain in left shoulder: Secondary | ICD-10-CM | POA: Diagnosis not present

## 2023-01-19 DIAGNOSIS — M75122 Complete rotator cuff tear or rupture of left shoulder, not specified as traumatic: Secondary | ICD-10-CM | POA: Diagnosis not present

## 2023-01-21 DIAGNOSIS — M75122 Complete rotator cuff tear or rupture of left shoulder, not specified as traumatic: Secondary | ICD-10-CM | POA: Diagnosis not present

## 2023-01-21 DIAGNOSIS — M25512 Pain in left shoulder: Secondary | ICD-10-CM | POA: Diagnosis not present

## 2023-01-22 ENCOUNTER — Other Ambulatory Visit: Payer: Self-pay | Admitting: Cardiovascular Disease

## 2023-01-25 DIAGNOSIS — M75122 Complete rotator cuff tear or rupture of left shoulder, not specified as traumatic: Secondary | ICD-10-CM | POA: Diagnosis not present

## 2023-01-25 DIAGNOSIS — M25512 Pain in left shoulder: Secondary | ICD-10-CM | POA: Diagnosis not present

## 2023-01-28 DIAGNOSIS — M25512 Pain in left shoulder: Secondary | ICD-10-CM | POA: Diagnosis not present

## 2023-01-28 DIAGNOSIS — M75122 Complete rotator cuff tear or rupture of left shoulder, not specified as traumatic: Secondary | ICD-10-CM | POA: Diagnosis not present

## 2023-02-01 DIAGNOSIS — M25512 Pain in left shoulder: Secondary | ICD-10-CM | POA: Diagnosis not present

## 2023-02-01 DIAGNOSIS — M75122 Complete rotator cuff tear or rupture of left shoulder, not specified as traumatic: Secondary | ICD-10-CM | POA: Diagnosis not present

## 2023-02-04 DIAGNOSIS — M25512 Pain in left shoulder: Secondary | ICD-10-CM | POA: Diagnosis not present

## 2023-02-04 DIAGNOSIS — M75122 Complete rotator cuff tear or rupture of left shoulder, not specified as traumatic: Secondary | ICD-10-CM | POA: Diagnosis not present

## 2023-02-08 DIAGNOSIS — M25512 Pain in left shoulder: Secondary | ICD-10-CM | POA: Diagnosis not present

## 2023-02-08 DIAGNOSIS — M75122 Complete rotator cuff tear or rupture of left shoulder, not specified as traumatic: Secondary | ICD-10-CM | POA: Diagnosis not present

## 2023-02-11 DIAGNOSIS — M75122 Complete rotator cuff tear or rupture of left shoulder, not specified as traumatic: Secondary | ICD-10-CM | POA: Diagnosis not present

## 2023-02-11 DIAGNOSIS — M25512 Pain in left shoulder: Secondary | ICD-10-CM | POA: Diagnosis not present

## 2023-02-15 DIAGNOSIS — M25512 Pain in left shoulder: Secondary | ICD-10-CM | POA: Diagnosis not present

## 2023-02-15 DIAGNOSIS — M75122 Complete rotator cuff tear or rupture of left shoulder, not specified as traumatic: Secondary | ICD-10-CM | POA: Diagnosis not present

## 2023-02-16 ENCOUNTER — Other Ambulatory Visit: Payer: Self-pay | Admitting: Cardiovascular Disease

## 2023-02-18 DIAGNOSIS — M25512 Pain in left shoulder: Secondary | ICD-10-CM | POA: Diagnosis not present

## 2023-02-18 DIAGNOSIS — M75122 Complete rotator cuff tear or rupture of left shoulder, not specified as traumatic: Secondary | ICD-10-CM | POA: Diagnosis not present

## 2023-02-18 DIAGNOSIS — J309 Allergic rhinitis, unspecified: Secondary | ICD-10-CM | POA: Diagnosis not present

## 2023-02-18 DIAGNOSIS — H1031 Unspecified acute conjunctivitis, right eye: Secondary | ICD-10-CM | POA: Diagnosis not present

## 2023-02-22 DIAGNOSIS — M75122 Complete rotator cuff tear or rupture of left shoulder, not specified as traumatic: Secondary | ICD-10-CM | POA: Diagnosis not present

## 2023-02-22 DIAGNOSIS — M25512 Pain in left shoulder: Secondary | ICD-10-CM | POA: Diagnosis not present

## 2023-02-25 DIAGNOSIS — M25512 Pain in left shoulder: Secondary | ICD-10-CM | POA: Diagnosis not present

## 2023-02-25 DIAGNOSIS — M75122 Complete rotator cuff tear or rupture of left shoulder, not specified as traumatic: Secondary | ICD-10-CM | POA: Diagnosis not present

## 2023-03-01 DIAGNOSIS — M75122 Complete rotator cuff tear or rupture of left shoulder, not specified as traumatic: Secondary | ICD-10-CM | POA: Diagnosis not present

## 2023-03-01 DIAGNOSIS — M25512 Pain in left shoulder: Secondary | ICD-10-CM | POA: Diagnosis not present

## 2023-03-04 ENCOUNTER — Other Ambulatory Visit: Payer: Self-pay | Admitting: Cardiovascular Disease

## 2023-03-04 DIAGNOSIS — M25512 Pain in left shoulder: Secondary | ICD-10-CM | POA: Diagnosis not present

## 2023-03-04 DIAGNOSIS — M75122 Complete rotator cuff tear or rupture of left shoulder, not specified as traumatic: Secondary | ICD-10-CM | POA: Diagnosis not present

## 2023-03-08 DIAGNOSIS — M25512 Pain in left shoulder: Secondary | ICD-10-CM | POA: Diagnosis not present

## 2023-03-08 DIAGNOSIS — M75122 Complete rotator cuff tear or rupture of left shoulder, not specified as traumatic: Secondary | ICD-10-CM | POA: Diagnosis not present

## 2023-03-12 DIAGNOSIS — M25512 Pain in left shoulder: Secondary | ICD-10-CM | POA: Diagnosis not present

## 2023-03-12 DIAGNOSIS — M75122 Complete rotator cuff tear or rupture of left shoulder, not specified as traumatic: Secondary | ICD-10-CM | POA: Diagnosis not present

## 2023-03-15 DIAGNOSIS — M25512 Pain in left shoulder: Secondary | ICD-10-CM | POA: Diagnosis not present

## 2023-03-15 DIAGNOSIS — L03211 Cellulitis of face: Secondary | ICD-10-CM | POA: Diagnosis not present

## 2023-03-15 DIAGNOSIS — M75122 Complete rotator cuff tear or rupture of left shoulder, not specified as traumatic: Secondary | ICD-10-CM | POA: Diagnosis not present

## 2023-04-05 ENCOUNTER — Encounter: Payer: Self-pay | Admitting: Cardiovascular Disease

## 2023-04-12 ENCOUNTER — Encounter: Payer: Self-pay | Admitting: Gastroenterology

## 2023-04-12 ENCOUNTER — Telehealth: Payer: Self-pay | Admitting: Gastroenterology

## 2023-04-12 NOTE — Telephone Encounter (Signed)
I am happy to see him in the office for a new patient visit. Thanks

## 2023-04-12 NOTE — Telephone Encounter (Signed)
Hi Dr. Adela Lank,   Patient called requesting a transfer of care from Dr. Silvana Newness office. States he is no longer doing any procedures and he specifically asking for you to possibly continue his GI care. Records were requested and obtained for you to review. Records have been scanned into Media.  Please advise on scheduling.   Thanks

## 2023-04-14 ENCOUNTER — Encounter: Payer: Self-pay | Admitting: Gastroenterology

## 2023-04-14 ENCOUNTER — Ambulatory Visit (INDEPENDENT_AMBULATORY_CARE_PROVIDER_SITE_OTHER): Payer: Medicare Other | Admitting: Gastroenterology

## 2023-04-14 VITALS — BP 136/80 | HR 76 | Ht 68.0 in | Wt 208.0 lb

## 2023-04-14 DIAGNOSIS — Z860100 Personal history of colon polyps, unspecified: Secondary | ICD-10-CM

## 2023-04-14 DIAGNOSIS — R109 Unspecified abdominal pain: Secondary | ICD-10-CM | POA: Diagnosis not present

## 2023-04-14 DIAGNOSIS — K2210 Ulcer of esophagus without bleeding: Secondary | ICD-10-CM

## 2023-04-14 DIAGNOSIS — Z79899 Other long term (current) drug therapy: Secondary | ICD-10-CM | POA: Diagnosis not present

## 2023-04-14 DIAGNOSIS — K2100 Gastro-esophageal reflux disease with esophagitis, without bleeding: Secondary | ICD-10-CM

## 2023-04-14 MED ORDER — NA SULFATE-K SULFATE-MG SULF 17.5-3.13-1.6 GM/177ML PO SOLN
1.0000 | Freq: Once | ORAL | 0 refills | Status: AC
Start: 2023-04-14 — End: 2023-04-14

## 2023-04-14 MED ORDER — IBGARD 90 MG PO CPCR: ORAL_CAPSULE | ORAL | 0 refills | Status: DC

## 2023-04-14 NOTE — Progress Notes (Signed)
HPI :  72 y/o male with a history of GERD, chronic PPI use, history of colon polyps, here to establish for his GI care.  He was previously followed by Dr. Charm Barges in the past who is retiring.  I had a chance to review his prior records from [place] as outlined below with procedures.  He has a longstanding GI history.  He has had reflux for some time.  His main symptoms have been regurgitation over the years.  Pyrosis is typically controlled.  She has had some periodic dysphagia in the past although not much recently.  Last summer he was having abdominal discomfort in the setting of NSAID use.  He had an upper endoscopy which showed an 8 mm ulcer at the GEJ.  He has had prior EGDs in the past which showed no Barrett's.  He was taken off NSAIDs and continued on Protonix and states his symptoms have improved.  He takes Tums nightly to prevent some nocturnal symptoms.  He has some breakthrough of nocturnal reflux perhaps once per week to every 2 weeks.  Nothing too bad.  He states this usually occurs when he eats too much prior to bedtime.  Otherwise has no nausea or vomiting.  No routine abdominal pains.  He has been on Protonix 40 mg daily for some time, he cannot recall other dosing he has been on in the past.  He states generally he is pretty happy with the regimen.  He denies any history of CKD, osteoporosis, or bone fractures.  He does move his bowels regularly, usually once per day.  No blood in his stools.  He does have some cramping in his abdomen at times which can resolve with passing gas or belching.  He has been on dicyclomine in the past.  His last colonoscopy was in 2019, he had 2 sessile serrated polyps removed and told to repeat in 5 years.  He has no family history of colon cancer, esophageal cancer, or gastric cancer that he is aware of.  He is followed by Dr. Eden Emms of cardiology.  He denies any cardiopulmonary symptoms at this time and feels well.   Prior EGDs and colonoscopies  reviewed: seen at [facility]  EGD 07/01/22 - 8mm linear ulcer at the GEJ, gastritis - biopsies negative for H pylori EGD 09/27/20 - gastritis, histal hernia EGD 12/07/17 - GEJ stricture dilated to 54 Fr EGD 6/9/9 - irregular z line biopsied, erosive esophagitis EGD 2/20/9 - erosive esophagitis, HH, gastric ulcer and duodenal bulb ulcer  Colonoscopy 12/07/17 - 2 small sessile serrated polyps, diverticular disease in left colon, hemorrhoids - recommended repeat in 5 years Colonoscopy 12/30/07 - 1.3cm adenoma sigmoid colon, 4mm rectal adenoma, diverticular disease   Past Medical History:  Diagnosis Date   Arthritis    Bilateral nephrolithiasis    Chest pain    Diverticulosis    Fatty liver 06/16/2022   abdominal ultrasound   GERD (gastroesophageal reflux disease)    Hx of adenomatous polyp of colon 11/2017   and '09   Hypertension    Prostatism    Sensorineural hearing loss (SNHL) of both ears      Past Surgical History:  Procedure Laterality Date   CHOLECYSTECTOMY  2021   EXTRACORPOREAL SHOCK WAVE LITHOTRIPSY Left 07/20/2022   Procedure: EXTRACORPOREAL SHOCK WAVE LITHOTRIPSY (ESWL);  Surgeon: Jerilee Field, MD;  Location: [facility];  Service: Urology;  Laterality: Left;   EYE SURGERY Bilateral    Cataract   NASAL SEPTOPLASTY  W/ TURBINOPLASTY  01/2016   Family History  Problem Relation Age of Onset   Arthritis Mother    Hearing loss Mother    Coronary artery disease Father    Stroke Father    Hearing loss Father    Breast cancer Sister    Diabetes Brother    Heart attack Paternal Grandfather        45   Colon cancer Neg Hx    Stomach cancer Neg Hx    Esophageal cancer Neg Hx    Social History   Tobacco Use   Smoking status: Former    Packs/day: 1.00    Years: 20.00    Additional pack years: 0.00    Total pack years: 20.00    Types: Cigarettes    Quit date: 11/10/1983    Years since quitting: 39.4   Smokeless tobacco: Never  Vaping Use    Vaping Use: Never used  Substance Use Topics   Alcohol use: Yes    Alcohol/week: 0.0 standard drinks of alcohol    Comment: ocassionally   Drug use: No   Current Outpatient Medications  Medication Sig Dispense Refill   alfuzosin (UROXATRAL) 10 MG 24 hr tablet Take 10 mg by mouth daily.     atorvastatin (LIPITOR) 10 MG tablet TAKE 1 TABLET BY MOUTH EVERY DAY 90 tablet 3   furosemide (LASIX) 20 MG tablet TAKE 1 TABET BY MOUTH EVERY DAY 90 tablet 3   HYDROcodone-acetaminophen (NORCO) 7.5-325 MG tablet Take 1 tablet by mouth every 6 (six) hours as needed for moderate pain. 12 tablet 0   losartan (COZAAR) 100 MG tablet TAKE 1 TABLET BY MOUTH EVERY DAY 90 tablet 3   pantoprazole (PROTONIX) 40 MG tablet Take 40 mg by mouth daily.     No current facility-administered medications for this visit.   No Known Allergies   Review of Systems: All systems reviewed and negative except where noted in HPI.   Lab Results  Component Value Date   WBC 7.8 12/10/2021   HGB 14.9 12/10/2021   HCT 43.3 12/10/2021   MCV 95 12/10/2021   PLT 198 12/10/2021    Lab Results  Component Value Date   CREATININE 1.07 12/10/2021   BUN 12 12/10/2021   NA 140 12/10/2021   K 4.0 12/10/2021   CL 104 12/10/2021   CO2 26 12/10/2021    Lab Results  Component Value Date   ALT 19 05/19/2022   AST 16 05/19/2022   ALKPHOS 51 05/19/2022   BILITOT 0.5 05/19/2022     Physical Exam: BP 136/80   Pulse 76   Ht 5\' 8"  (1.727 m)   Wt 208 lb (94.3 kg)   BMI 31.63 kg/m  Constitutional: Pleasant,well-developed, male in no acute distress. HEENT: Normocephalic and atraumatic. Conjunctivae are normal. No scleral icterus. Neck supple.  Cardiovascular: Normal rate, regular rhythm.  Pulmonary/chest: Effort normal and breath sounds normal.  Abdominal: Soft, nondistended, nontender.  There are no masses palpable. No hepatomegaly. Extremities: no edema Lymphadenopathy: No cervical adenopathy noted. Neurological:  Alert and oriented to person place and time. Skin: Skin is warm and dry. No rashes noted. Psychiatric: Normal mood and affect. Behavior is normal.   ASSESSMENT: 72 y.o. male here for assessment of the following  1. Gastroesophageal reflux disease with esophagitis without hemorrhage   2. Ulcer of esophagus without bleeding   3. Long-term current use of proton pump inhibitor therapy   4. Abdominal cramps   5. History of colon polyps  Longstanding GERD with history of esophageal ulcer in the setting of NSAID use last year.  It was recommended for him to avoid NSAIDs and repeat an EGD in several months to confirm mucosal healing and rule out Barrett's.  Historically he has not had any evidence of Barrett's and hopefully that risk is low.  Generally doing pretty well in regards to his reflux symptoms, has been off NSAIDs.  He does take Tums fairly frequently at night before bed to help prevent some nocturnal symptoms and this generally works.  He has occasional breakthrough.  We discussed long-term PPI use, risks of that versus benefits.  He feels benefits outweigh risks at this time. He wishes to continue with current dosing of protonix. We discussed upper endoscopy and what that entails, risks of the procedure and anesthesia.  He does wish to proceed with an upper endoscopy to ensure mucosal healing and ensure no evidence of Barrett's.  We will schedule this at the [facility].  Otherwise he is due for surveillance colonoscopy.  He would like to do this at the same time as his upper endoscopy.  We discussed colonoscopy, risks and benefits of that as well and he wants to proceed.  Otherwise he does have some regular bowel habits but intermittent abdominal cramping which sounds like bowel spasm from intestinal gas or bloating, reliably relieved with passing gas.  Gave him some IBgard to use as needed to see if that helps, samples provided.   Further recommendations pending his course and results of his  procedures.  He agrees with the plan.  Otherwise no cardiopulmonary symptoms at this time, I think he is stable to have these done at the [facility].   PLAN: - schedule EGD and colonoscopy at the [facility] - avoid NSAIDs - continue protonix at dosing - discussed long term risks/ benefits of chronic PPI, use lowest dose needed to control symptoms over time - TUMS q HS for now - samples of IB gard to use PRN for cramps  Harlin Rain, MD [facility]

## 2023-04-14 NOTE — Patient Instructions (Addendum)
You have been scheduled for an endoscopy and colonoscopy. Please follow the written instructions given to you at your visit today. Please pick up your prep supplies at the pharmacy within the next 1-3 days. If you use inhalers (even only as needed), please bring them with you on the day of your procedure.  We have given you samples of the following medication to take: IBgard: Use as directed as needed   Thank you for entrusting me with your care and for choosing [facility], Dr. Ileene Patrick    If your blood pressure at your visit was 140/90 or greater, please contact your primary care physician to follow up on this. ______________________________________________________  If you are age 9 or older, your body mass index should be between 23-30. Your Body mass index is 31.63 kg/m. If this is out of the aforementioned range listed, please consider follow up with your Primary Care Provider.  If you are age 13 or younger, your body mass index should be between 19-25. Your Body mass index is 31.63 kg/m. If this is out of the aformentioned range listed, please consider follow up with your Primary Care Provider.  ________________________________________________________  The [facility] providers would like to encourage you to use [facility] to communicate with providers for non-urgent requests or questions.  Due to long hold times on the telephone, sending your provider a message by [facility] may be a faster and more efficient way to get a response.  Please allow 48 business hours for a response.  Please remember that this is for non-urgent requests.  _______________________________________________________  Due to recent changes in healthcare laws, you may see the results of your imaging and laboratory studies on MyChart before your provider has had a chance to review them.  We understand that in some cases there may be results that are confusing or concerning to you. Not all laboratory  results come back in the same time frame and the provider may be waiting for multiple results in order to interpret others.  Please give [place] 48 hours in order for your provider to thoroughly review all the results before contacting the office for clarification of your results.

## 2023-05-06 ENCOUNTER — Ambulatory Visit (AMBULATORY_SURGERY_CENTER): Payer: Medicare Other | Admitting: Gastroenterology

## 2023-05-06 ENCOUNTER — Encounter: Payer: Self-pay | Admitting: Gastroenterology

## 2023-05-06 VITALS — BP 151/83 | HR 69 | Temp 98.5°F | Resp 16 | Ht 68.0 in | Wt 208.0 lb

## 2023-05-06 DIAGNOSIS — K2100 Gastro-esophageal reflux disease with esophagitis, without bleeding: Secondary | ICD-10-CM | POA: Diagnosis not present

## 2023-05-06 DIAGNOSIS — D124 Benign neoplasm of descending colon: Secondary | ICD-10-CM | POA: Diagnosis not present

## 2023-05-06 DIAGNOSIS — K449 Diaphragmatic hernia without obstruction or gangrene: Secondary | ICD-10-CM | POA: Diagnosis not present

## 2023-05-06 DIAGNOSIS — K31A0 Gastric intestinal metaplasia, unspecified: Secondary | ICD-10-CM | POA: Diagnosis not present

## 2023-05-06 DIAGNOSIS — Z86010 Personal history of colonic polyps: Secondary | ICD-10-CM | POA: Diagnosis not present

## 2023-05-06 DIAGNOSIS — D120 Benign neoplasm of cecum: Secondary | ICD-10-CM | POA: Diagnosis not present

## 2023-05-06 DIAGNOSIS — D123 Benign neoplasm of transverse colon: Secondary | ICD-10-CM

## 2023-05-06 DIAGNOSIS — K317 Polyp of stomach and duodenum: Secondary | ICD-10-CM | POA: Diagnosis not present

## 2023-05-06 DIAGNOSIS — D122 Benign neoplasm of ascending colon: Secondary | ICD-10-CM

## 2023-05-06 DIAGNOSIS — Z09 Encounter for follow-up examination after completed treatment for conditions other than malignant neoplasm: Secondary | ICD-10-CM | POA: Diagnosis not present

## 2023-05-06 DIAGNOSIS — K2950 Unspecified chronic gastritis without bleeding: Secondary | ICD-10-CM | POA: Diagnosis not present

## 2023-05-06 HISTORY — PX: COLONOSCOPY WITH ESOPHAGOGASTRODUODENOSCOPY (EGD): SHX5779

## 2023-05-06 MED ORDER — SODIUM CHLORIDE 0.9 % IV SOLN: 500.0000 mL | Freq: Once | INTRAVENOUS | Status: DC

## 2023-05-06 NOTE — Op Note (Signed)
[facility] Patient Name: Barry Mcdonald Procedure Date: 05/06/2023 3:51 PM MRN: [id number] Endoscopist: Viviann Spare P. Adela Lank , MD, [id number] Age: 72 Referring MD:  Date of Birth: [birth year] Gender: Male Account #: [id number] Procedure:                Upper GI endoscopy Indications:              Follow-up of reflux esophagitis - last EGD 06/2022                            showed an esophageal ulcer. On protonix, good                            control of symptoms. EGD to assess for mucosal                            healing and rule out Barrett's Medicines:                Monitored Anesthesia Care Procedure:                Pre-Anesthesia Assessment:                           - Prior to the procedure, a History and Physical                            was performed, and patient medications and                            allergies were reviewed. The patient's tolerance of                            previous anesthesia was also reviewed. The risks                            and benefits of the procedure and the sedation                            options and risks were discussed with the patient.                            All questions were answered, and informed consent                            was obtained. Prior Anticoagulants: The patient has                            taken no anticoagulant or antiplatelet agents. ASA                            Grade Assessment: II - A patient with mild systemic                            disease. After reviewing the risks and benefits,  the patient was deemed in satisfactory condition to                            undergo the procedure.                           After obtaining informed consent, the endoscope was                            passed under direct vision. Throughout the                            procedure, the patient's blood pressure, pulse, and                            oxygen saturations were  monitored continuously. The                            GIF [id number] #[id number] was introduced through the                            mouth, and advanced to the second part of duodenum.                            The upper GI endoscopy was accomplished without                            difficulty. The patient tolerated the procedure                            well. Scope In: Scope Out: Findings:                 Esophagogastric landmarks were identified: the                            Z-line was found at 39 cm, the gastroesophageal                            junction was found at 39 cm and the upper extent of                            the gastric folds was found at 41 cm from the                            incisors.                           A 2 cm hiatal hernia was present.                           The Z-line was slightly irregular but did not meet                            criteria for Barrett's.  The exam of the esophagus was otherwise normal.                           A single 4 mm sessile polyp was found in the                            cardia. The polyp was seemingly removed with a cold                            biopsy forceps. Resection and retrieval were                            complete.                           The exam of the stomach was otherwise normal.                           A single 3 mm sessile polyp was found in the                            duodenal bulb. The polyp was removed with a cold                            biopsy forceps. Resection and retrieval were                            complete.                           The exam of the duodenum was otherwise normal. Complications:            No immediate complications. Estimated blood loss:                            Minimal. Estimated Blood Loss:     Estimated blood loss was minimal. Impression:               - Esophagogastric landmarks identified.                           - 2 cm  hiatal hernia.                           - Z-line irregular but did not meet criteria for                            Barrett's. Interval healing of esophageal ulcer.                           - A single gastric polyp. Resected and retrieved.                           - Normal stomach otherwise.                           -  A single duodenal polyp. Resected and retrieved.                           - Normal duodenum otherwise. Recommendation:           - Patient has a contact number available for                            emergencies. The signs and symptoms of potential                            delayed complications were discussed with the                            patient. Return to normal activities tomorrow.                            Written discharge instructions were provided to the                            patient.                           - Resume previous diet.                           - Continue present medications.                           - Await pathology results. Viviann Spare P. Adela Lank, MD 05/06/2023 4:37:58 PM This report has been signed electronically.

## 2023-05-06 NOTE — Progress Notes (Signed)
Called to room to assist during endoscopic procedure.  Patient ID and intended procedure confirmed with present staff. Received instructions for my participation in the procedure from the performing physician.  

## 2023-05-06 NOTE — Patient Instructions (Addendum)
Recommendation:           - Patient has a contact number available for                            emergencies. The signs and symptoms of potential                            delayed complications were discussed with the                            patient. Return to normal activities tomorrow.                            Written discharge instructions were provided to the                            patient.                           - Resume previous diet.                           - Continue present medications.                           - Await pathology results.  Handouts on polyps, hemorrhoids and diverticulosis given.   YOU HAD AN ENDOSCOPIC PROCEDURE TODAY AT THE [facility]:   Refer to the procedure report that was given to you for any specific questions about what was found during the examination.  If the procedure report does not answer your questions, please call your gastroenterologist to clarify.  If you requested that your care partner not be given the details of your procedure findings, then the procedure report has been included in a sealed envelope for you to review at your convenience later.  YOU SHOULD EXPECT: Some feelings of bloating in the abdomen. Passage of more gas than usual.  Walking can help get rid of the air that was put into your GI tract during the procedure and reduce the bloating. If you had a lower endoscopy (such as a colonoscopy or flexible sigmoidoscopy) you may notice spotting of blood in your stool or on the toilet paper. If you underwent a bowel prep for your procedure, you may not have a normal bowel movement for a few days.  Please Note:  You might notice some irritation and congestion in your nose or some drainage.  This is from the oxygen used during your procedure.  There is no need for concern and it should clear up in a day or so.  SYMPTOMS TO REPORT IMMEDIATELY:  Following lower endoscopy (colonoscopy or flexible  sigmoidoscopy):  Excessive amounts of blood in the stool  Significant tenderness or worsening of abdominal pains  Swelling of the abdomen that is new, acute  Fever of 100F or higher  Following upper endoscopy (EGD)  Vomiting of blood or coffee ground material  New chest pain or pain under the shoulder blades  Painful or persistently difficult swallowing  New shortness of breath  Fever of 100F or higher  Black, tarry-looking stools  For urgent or emergent issues, a gastroenterologist can be reached at  any hour by calling [phone]. Do not use MyChart messaging for urgent concerns.    DIET:  We do recommend a small meal at first, but then you may proceed to your regular diet.  Drink plenty of fluids but you should avoid alcoholic beverages for 24 hours.  ACTIVITY:  You should plan to take it easy for the rest of today and you should NOT DRIVE or use heavy machinery until tomorrow (because of the sedation medicines used during the test).    FOLLOW UP: Our staff will call the number listed on your records the next business day following your procedure.  We will call around 7:15- 8:00 am to check on you and address any questions or concerns that you may have regarding the information given to you following your procedure. If we do not reach you, we will leave a message.     If any biopsies were taken you will be contacted by phone or by letter within the next 1-3 weeks.  Please call us at [phone] if you have not heard about the biopsies in 3 weeks.    SIGNATURES/CONFIDENTIALITY: You and/or your care partner have signed paperwork which will be entered into your electronic medical record.  These signatures attest to the fact that that the information above on your After Visit Summary has been reviewed and is understood.  Full responsibility of the confidentiality of this discharge information lies with you and/or your care-partner.

## 2023-05-06 NOTE — Progress Notes (Signed)
History and Physical Interval Note: Patient seen on 04/14/23 in the office - no interval changes. On protonix 40mg  / day - history of EGD in 06/2022 while on NSAIDs, ulcer noted at the GEJ at that time. EGD done to survey and rule out Barrett's. Last colonoscopy 2019 with 2 SSPs, here for colonoscopy surveillance of polyps. Otherwise feels well today without complaints and wishes to proceed.  05/06/2023 3:43 PM  Barry Mcdonald  has presented today for endoscopic procedure(s), with the diagnosis of  Encounter Diagnoses  Name Primary?   Gastroesophageal reflux disease with esophagitis without hemorrhage Yes   History of colon polyps   .  The various methods of evaluation and treatment have been discussed with the patient and/or family. After consideration of risks, benefits and other options for treatment, the patient has consented to  the endoscopic procedure(s).   The patient's history has been reviewed, patient examined, no change in status, stable for surgery.  I have reviewed the patient's chart and labs.  Questions were answered to the patient's satisfaction.    Harlin Rain, MD [facility]

## 2023-05-06 NOTE — Op Note (Signed)
[facility] Patient Name: Barry Mcdonald Procedure Date: 05/06/2023 3:42 PM MRN: [id number] Endoscopist: Viviann Spare P. Adela Lank , MD, [id number] Age: 72 Referring MD:  Date of Birth: [birth year] Gender: Male Account #: [id number] Procedure:                Colonoscopy Indications:              High risk colon cancer surveillance: Personal                            history of colonic polyps - 2 sessile serrated                            polyps removed 2019 Medicines:                Monitored Anesthesia Care Procedure:                Pre-Anesthesia Assessment:                           - Prior to the procedure, a History and Physical                            was performed, and patient medications and                            allergies were reviewed. The patient's tolerance of                            previous anesthesia was also reviewed. The risks                            and benefits of the procedure and the sedation                            options and risks were discussed with the patient.                            All questions were answered, and informed consent                            was obtained. Prior Anticoagulants: The patient has                            taken no anticoagulant or antiplatelet agents. ASA                            Grade Assessment: II - A patient with mild systemic                            disease. After reviewing the risks and benefits,                            the patient was deemed in satisfactory condition to  undergo the procedure.                           After obtaining informed consent, the colonoscope                            was passed under direct vision. Throughout the                            procedure, the patient's blood pressure, pulse, and                            oxygen saturations were monitored continuously. The                            CF [id number] #[id number] was introduced  through the anus                            and advanced to the the cecum, identified by                            appendiceal orifice and ileocecal valve. The                            colonoscopy was performed without difficulty. The                            patient tolerated the procedure well. The quality                            of the bowel preparation was adequate. The                            ileocecal valve, appendiceal orifice, and rectum                            were photographed. Scope In: 4:06:56 PM Scope Out: 4:26:53 PM Scope Withdrawal Time: 0 hours 16 minutes 13 seconds  Total Procedure Duration: 0 hours 19 minutes 57 seconds  Findings:                 The perianal and digital rectal examinations were                            normal.                           Two sessile polyps were found in the cecum. The                            polyps were 2 to 3 mm in size. These polyps were                            removed with a cold snare. Resection and retrieval  were complete.                           A 4 mm polyp was found in the ascending colon. The                            polyp was sessile. The polyp was removed with a                            cold snare. Resection and retrieval were complete.                           Two sessile polyps were found in the transverse                            colon. The polyps were 2-4 mm in size. These polyps                            were removed with a cold snare. Resection and                            retrieval were complete.                           A 3 mm polyp was found in the descending colon. The                            polyp was sessile. The polyp was removed with a                            cold snare. Resection and retrieval were complete.                           Many medium-mouthed diverticula were found in the                            entire colon.                            Internal hemorrhoids were found during retroflexion.                           The exam was otherwise without abnormality. Complications:            No immediate complications. Estimated blood loss:                            Minimal. Estimated Blood Loss:     Estimated blood loss was minimal. Impression:               - Two 2 to 3 mm polyps in the cecum, removed with a                            cold snare. Resected and retrieved.                           -  One 4 mm polyp in the ascending colon, removed                            with a cold snare. Resected and retrieved.                           - Two 2 to 4 mm polyps in the transverse colon,                            removed with a cold snare. Resected and retrieved.                           - One 3 mm polyp in the descending colon, removed                            with a cold snare. Resected and retrieved.                           - Diverticulosis in the entire examined colon.                           - Internal hemorrhoids.                           - The examination was otherwise normal. Recommendation:           - Patient has a contact number available for                            emergencies. The signs and symptoms of potential                            delayed complications were discussed with the                            patient. Return to normal activities tomorrow.                            Written discharge instructions were provided to the                            patient.                           - Resume previous diet.                           - Continue present medications.                           - Await pathology results. Viviann Spare P. Tannah Dreyfuss, MD 05/06/2023 4:33:17 PM This report has been signed electronically.

## 2023-05-06 NOTE — Progress Notes (Signed)
Vss nad trans to pacu 

## 2023-05-06 NOTE — Progress Notes (Signed)
Pt's states no medical or surgical changes since previsit or office visit. 

## 2023-05-07 ENCOUNTER — Telehealth: Payer: Self-pay | Admitting: *Deleted

## 2023-05-07 NOTE — Telephone Encounter (Signed)
  Follow up Call-     05/06/2023    2:57 PM  Call back number  Post procedure Call Back phone  # [phone]  Permission to leave phone message Yes     Patient questions:  Do you have a fever, pain , or abdominal swelling? No. Pain Score  0 *  Have you tolerated food without any problems? Yes.    Have you been able to return to your normal activities? Yes.    Do you have any questions about your discharge instructions: Diet   No. Medications  No. Follow up visit  No.  Do you have questions or concerns about your Care? No.  Actions: * If pain score is 4 or above: No action needed, pain <4.

## 2023-05-17 ENCOUNTER — Encounter: Payer: Self-pay | Admitting: Gastroenterology

## 2023-05-20 DIAGNOSIS — S92912A Unspecified fracture of left toe(s), initial encounter for closed fracture: Secondary | ICD-10-CM | POA: Diagnosis not present

## 2023-06-10 NOTE — Progress Notes (Signed)
Patient ID: Barry Mcdonald, male   DOB: 12/01/1950, 72 y.o.   MRN: [id number]     72 y.o. f/u HTN and atypical chest pain. Cath 2013 normal cors. On diuretic,norvasc and ARB for HTN   More sedentary during COVID [name] girlfriend Rx breast cancer lumpectomy/XRT 2020 He had his GB out 2020  Enjoys going to beach and [place] a lot   Had right/left Hallux medial nail avulsion 05/28/21   No cardiac issues Has some small dogs at home but doesn't walk them   Calcium score done 01/30/22 was 313 , 66 th percentile for age/sex  Majority of it in LAD Pn statin and ASA  LDL 58 on 05/19/22   GERD/Reflux. EGD 06/2022 with ulcer in GEJ while using NSAI's Also history of colon polyps 2019 F/U EGD 05/06/23 no Barrett's and gastric/duodenal polyp removed Colonoscopy with more polyps removed and some diverticulosis   Needs lipids / liver drawn today   ROS: Denies fever, malais, weight loss, blurry vision, decreased visual acuity, cough, sputum, SOB, hemoptysis, pleuritic pain, palpitaitons, heartburn, abdominal pain, melena, lower extremity edema, claudication, or rash.  All other systems reviewed and negative  General: BP 122/82   Pulse 75   Ht 5\' 8"  (1.727 m)   Wt 207 lb (93.9 kg)   SpO2 98%   BMI 31.47 kg/m    Affect appropriate Overweight white male  HEENT: normal Neck supple with no adenopathy JVP normal no bruits no thyromegaly Lungs clear with no wheezing and good diaphragmatic motion Heart:  S1/S2 no murmur, no rub, gallop or click PMI normal Abdomen: benighn, BS positve, no tenderness, no AAA ventral hernia  Rectus diastasis no bruit.  No HSM or HJR Distal pulses intact with no bruits Plus one edema Neuro non-focal Skin warm and dry No muscular weakness    Current Outpatient Medications  Medication Sig Dispense Refill   alfuzosin (UROXATRAL) 10 MG 24 hr tablet Take 10 mg by mouth daily.     atorvastatin (LIPITOR) 10 MG tablet TAKE 1 TABLET BY MOUTH EVERY DAY 90 tablet 3    furosemide (LASIX) 20 MG tablet TAKE 1 TABET BY MOUTH EVERY DAY 90 tablet 3   HYDROcodone-acetaminophen (NORCO) 7.5-325 MG tablet Take 1 tablet by mouth every 6 (six) hours as needed for moderate pain. 12 tablet 0   losartan (COZAAR) 100 MG tablet TAKE 1 TABLET BY MOUTH EVERY DAY 90 tablet 3   pantoprazole (PROTONIX) 40 MG tablet Take 40 mg by mouth daily.     Peppermint Oil (IBGARD) 90 MG CPCR Take as directed 12 capsule 0   No current facility-administered medications for this visit.    Allergies  Patient has no known allergies.  Electrocardiogram 12/15/22 SR rate 71 LVH no acute changes   Assessment and Plan  HTN:  Continue diuretic and ARB Norvasc started 08/2020   Edema: improved with diuretic -> dependant   GERD:  Continue proton pump inhibiter and low carb diet EGD June 2024 ok GI:  Rectus diastasis stable no need for surgery better with weight loss Post GB removal with no biliary cholic History of colon polyps last removed 05/06/23  HLD:  with above average calcium score Continue statin LdL at goal  CAD: sub clinical with high calcium score for age ASA/statin Discussed possible ETT for further risk stratification since calcium in LAD accounted for 304/313 of score and ECG is normal   ETT   F/U in 6 months if passes stress test   Charlton Haws

## 2023-06-15 ENCOUNTER — Encounter: Payer: Self-pay | Admitting: Cardiovascular Disease

## 2023-06-15 ENCOUNTER — Ambulatory Visit: Payer: Medicare Other | Attending: Cardiovascular Disease | Admitting: Cardiovascular Disease

## 2023-06-15 VITALS — BP 122/82 | HR 75 | Ht 68.0 in | Wt 207.0 lb

## 2023-06-15 DIAGNOSIS — E782 Mixed hyperlipidemia: Secondary | ICD-10-CM | POA: Diagnosis not present

## 2023-06-15 DIAGNOSIS — I10 Essential (primary) hypertension: Secondary | ICD-10-CM | POA: Insufficient documentation

## 2023-06-15 DIAGNOSIS — R931 Abnormal findings on diagnostic imaging of heart and coronary circulation: Secondary | ICD-10-CM | POA: Diagnosis not present

## 2023-06-15 LAB — LIPID PANEL
Chol/HDL Ratio: 3.4 ratio (ref 0.0–5.0)
Cholesterol, Total: 106 mg/dL (ref 100–199)
HDL: 31 mg/dL — ABNORMAL LOW (ref >39)
LDL Chol Calc (NIH): 57 mg/dL (ref 0–99)
Triglycerides: 91 mg/dL (ref 0–149)
VLDL Cholesterol Cal: 18 mg/dL (ref 5–40)

## 2023-06-15 LAB — HEPATIC FUNCTION PANEL
ALT: 14 IU/L (ref 0–44)
AST: 20 IU/L (ref 0–40)
Albumin: 4.4 g/dL (ref 3.9–4.9)
Alkaline Phosphatase: 81 IU/L (ref 44–121)
Bilirubin Total: 0.5 mg/dL (ref 0.0–1.2)
Bilirubin, Direct: 0.18 mg/dL (ref 0.00–0.40)
Total Protein: 6.1 g/dL (ref 6.0–8.5)

## 2023-06-15 NOTE — Patient Instructions (Signed)
Medication Instructions:  No changes *If you need a refill on your cardiac medications before your next appointment, please call your pharmacy*   Lab Work: Today: lipids/liver If you have labs (blood work) drawn today and your tests are completely normal, you will receive your results only by: MyChart Message (if you have MyChart) OR A paper copy in the mail If you have any lab test that is abnormal or we need to change your treatment, we will call you to review the results.   Testing/Procedures: Your physician has requested that you have an exercise tolerance test. For further information please visit [email or web address] Please also follow instruction sheet, as given.   Follow-Up: At [facility], you and your health needs are our priority.  As part of our continuing mission to provide you with exceptional heart care, we have created designated Provider Care Teams.  These Care Teams include your primary Cardiologist (physician) and Advanced Practice Providers (APPs -  Physician Assistants and Nurse Practitioners) who all work together to provide you with the care you need, when you need it.   Your next appointment:   12 month(s)  Provider:   Charlton Haws, MD

## 2023-06-15 NOTE — Addendum Note (Signed)
Addended by: Lendon Ka on: 06/15/2023 09:28 AM   Modules accepted: Orders

## 2023-06-15 NOTE — Addendum Note (Signed)
Addended by: Wendall Stade on: 06/15/2023 09:28 AM   Modules accepted: Orders

## 2023-06-16 ENCOUNTER — Telehealth: Payer: Self-pay

## 2023-06-16 DIAGNOSIS — E782 Mixed hyperlipidemia: Secondary | ICD-10-CM

## 2023-06-16 DIAGNOSIS — Z79899 Other long term (current) drug therapy: Secondary | ICD-10-CM

## 2023-06-16 NOTE — Telephone Encounter (Signed)
The patient has been notified of the result and verbalized understanding.  All questions (if any) were answered. Ethelda Chick, RN 06/16/2023 4:28 PM   Placed order for lab work and patient will come in on February for lab appointment.

## 2023-06-16 NOTE — Telephone Encounter (Signed)
-----   Message from Charlton Haws sent at 06/16/2023  8:07 AM EDT ----- Cholesterol is at goal and LFT's are normal F/U labs in 6 months

## 2023-06-17 DIAGNOSIS — M79675 Pain in left toe(s): Secondary | ICD-10-CM | POA: Diagnosis not present

## 2023-06-17 DIAGNOSIS — S92912D Unspecified fracture of left toe(s), subsequent encounter for fracture with routine healing: Secondary | ICD-10-CM | POA: Diagnosis not present

## 2023-06-22 ENCOUNTER — Ambulatory Visit: Payer: Medicare Other | Attending: Cardiovascular Disease

## 2023-06-22 DIAGNOSIS — I10 Essential (primary) hypertension: Secondary | ICD-10-CM | POA: Diagnosis not present

## 2023-06-22 DIAGNOSIS — R931 Abnormal findings on diagnostic imaging of heart and coronary circulation: Secondary | ICD-10-CM | POA: Diagnosis not present

## 2023-06-22 DIAGNOSIS — E782 Mixed hyperlipidemia: Secondary | ICD-10-CM | POA: Insufficient documentation

## 2023-06-22 LAB — EXERCISE TOLERANCE TEST
Angina Index: 0
Duke Treadmill Score: 8
Estimated workload: 10.1
Exercise duration (min): 8 min
Exercise duration (sec): 0 s
MPHR: 150 {beats}/min
Peak HR: 139 {beats}/min
Percent HR: 92 %
RPE: 17
Rest HR: 78 {beats}/min
ST Depression (mm): 0 mm

## 2023-06-26 IMAGING — CT CT CARDIAC CORONARY ARTERY CALCIUM SCORE
3 series · 14 of 20 positions shown, 16 images · non-contrast
Comparison: None.
COMPARISON: None.

Addendum:
EXAM:
OVER-READ INTERPRETATION  CT CHEST

The following report is an over-read performed by radiologist Dr.
Kee Montecinos [REDACTED] on 01/30/2022. This
over-read does not include interpretation of cardiac or coronary
anatomy or pathology. The coronary calcium score interpretation by
the cardiologist is attached.
TECHNIQUE: A gated, non-contrast computed tomography scan of the heart was
performed using 3mm slice thickness. Axial images were analyzed on a
dedicated workstation. Calcium scoring of the coronary arteries was
performed using the Agatston method.

[Series 2: cascseq 2.0 sa36 70% (id) · axial · 0.37mm/px · z∈[-247,-157]mm · 4 of 76 slices shown]
[im 16/76  vessel]
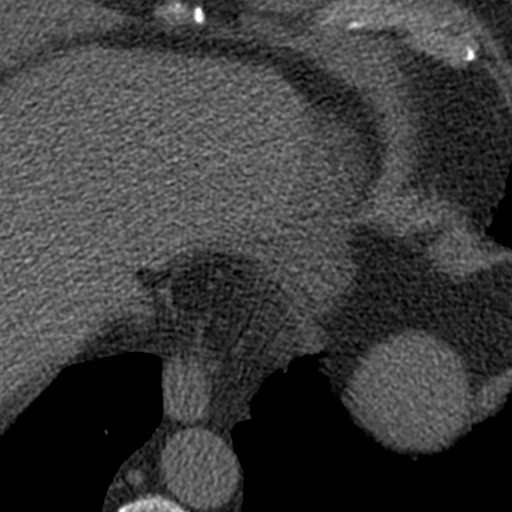
[im 31/76  vessel]
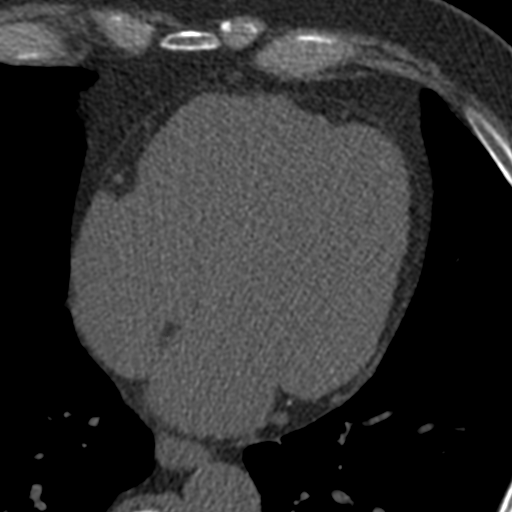
[im 46/76  vessel]
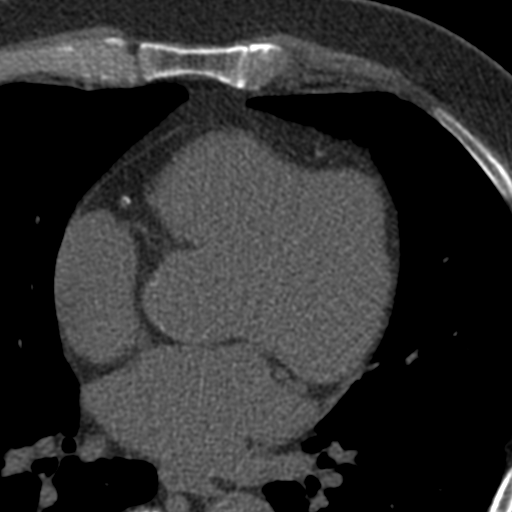
[im 61/76  vessel]
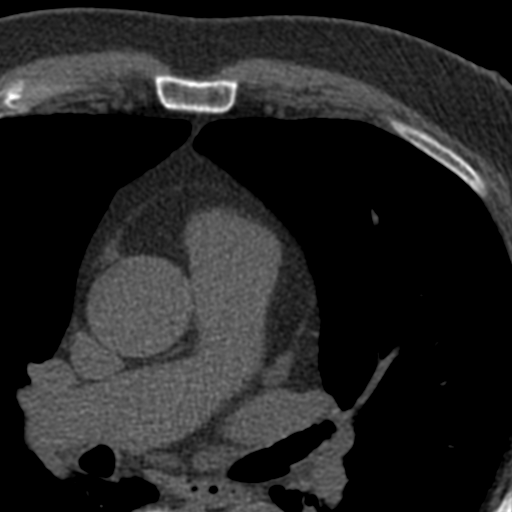

[Series 3: cascseq 2.0 bf37 st · axial · 0.73mm/px · z∈[-253,-153]mm · 5 of 76 slices shown, 7 images]
[im 13/76  vessel]
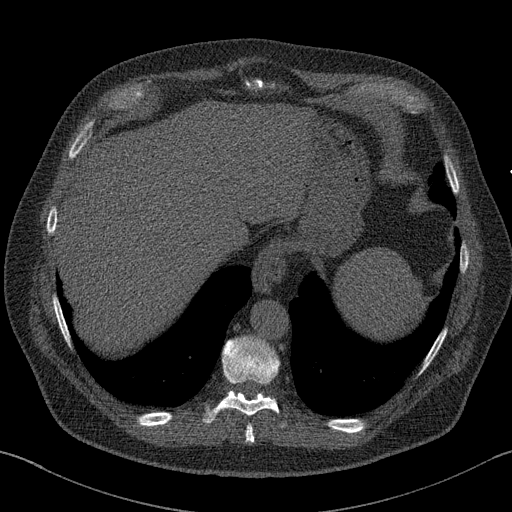
[im 13/76  lung]
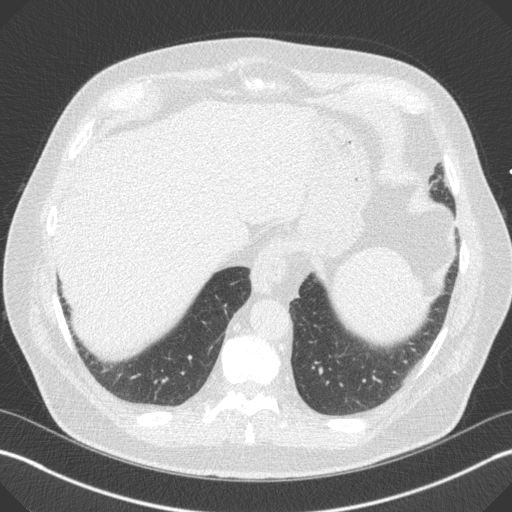
[im 26/76  vessel]
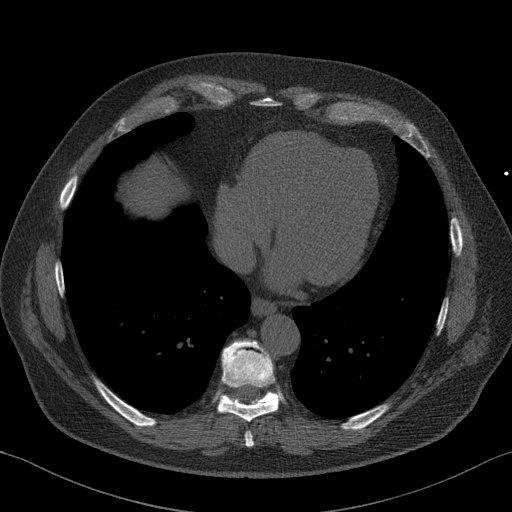
[im 38/76  vessel]
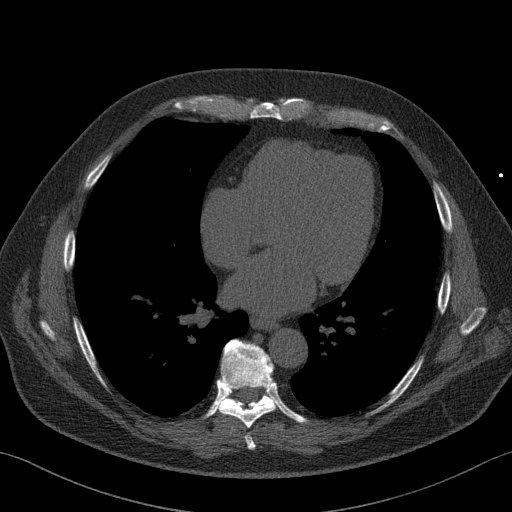
[im 51/76  vessel]
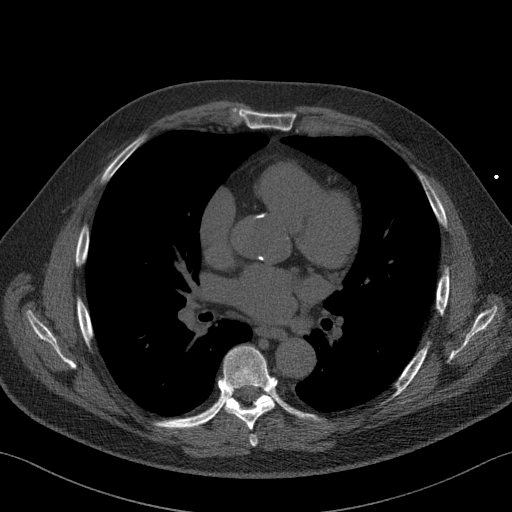
[im 63/76  vessel]
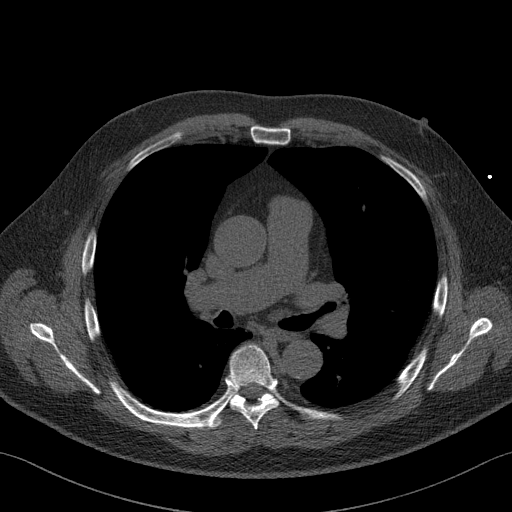
[im 63/76  lung]
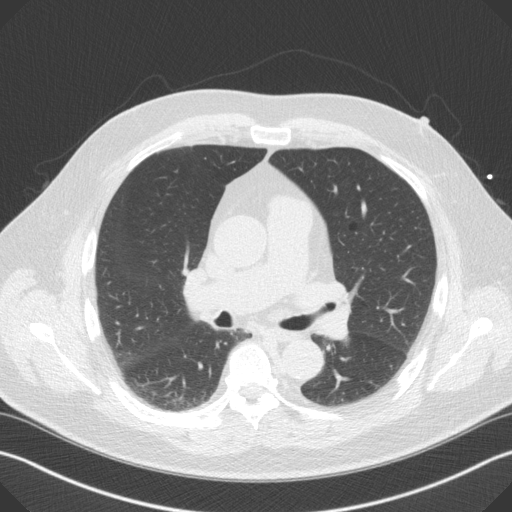

[Series 4: cascseq 2.0 br59 lung · axial · 0.73mm/px · z∈[-253,-153]mm · 5 of 76 slices shown]
[im 13/76  lung]
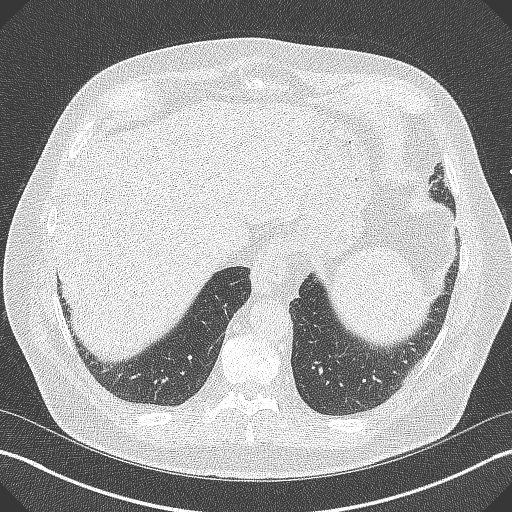
[im 26/76  lung]
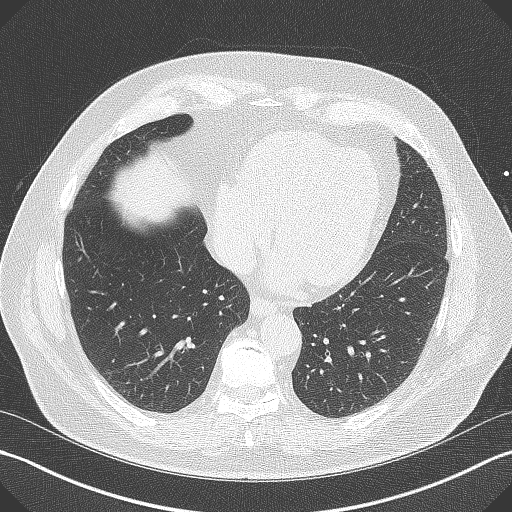
[im 38/76  lung]
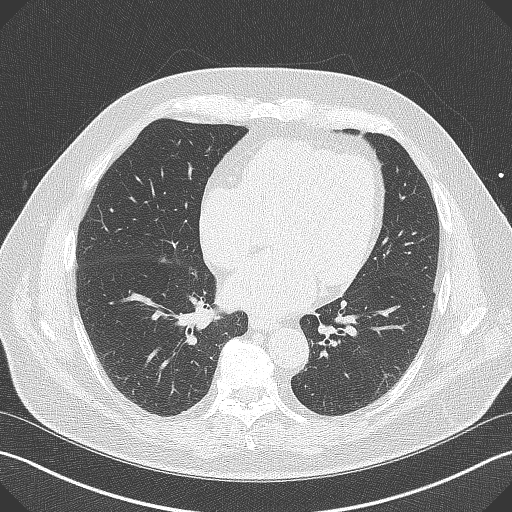
[im 51/76  lung]
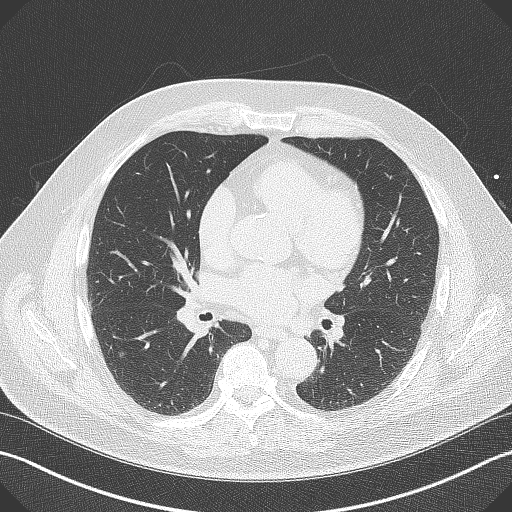
[im 63/76  lung]
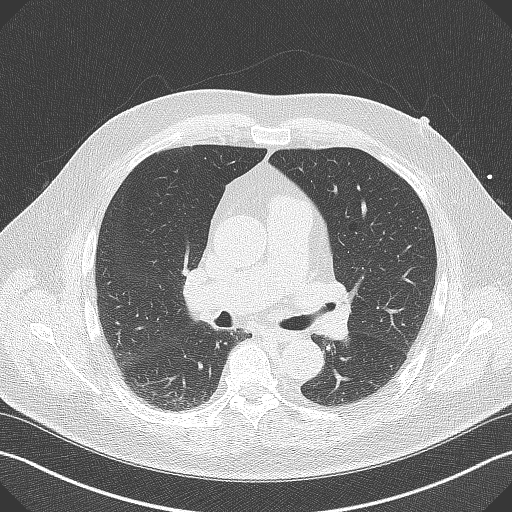

[14 of 20 positions shown; findings below may reference images not displayed]

FINDINGS: Atherosclerotic calcifications in the thoracic aorta. Within the
visualized portions of the thorax there are no suspicious appearing
pulmonary nodules or masses, there is no acute consolidative
airspace disease, no pleural effusions, no pneumothorax and no
lymphadenopathy. Visualized portions of the upper abdomen are
unremarkable. There are no aggressive appearing lytic or blastic
lesions noted in the visualized portions of the skeleton.
IMPRESSION: 1.  Aortic Atherosclerosis (VQDF0-XUC.C).

ADDENDUM:
Cardiovascular Disease Risk stratification

EXAM:
Coronary Calcium Score
FINDINGS: Coronary arteries: Normal origins.

Coronary Calcium Score:

Left main: 0

Left anterior descending artery: 304

Left circumflex artery: 0

Right coronary artery:

Total: 313

Percentile: 66

Pericardium: Normal.

Ascending Aorta: Normal caliber.

Non-cardiac: See separate report from [REDACTED].
IMPRESSION: Coronary calcium score of 313. This was 66 percentile for age-,
race-, and sex-matched controls.



If CAC=0, it is reasonable to withhold statin therapy and reassess
in 5 to 10 years, as long as higher risk conditions are absent
(diabetes mellitus, family history of premature CHD in first degree
relatives (males <55 years; females <65 years), cigarette smoking,
or LDL >=190 mg/dL).

If CAC is 1 to 99, it is reasonable to initiate statin therapy for
patients >=55 years of age.

If CAC is >=100 or >=75th percentile, it is reasonable to initiate
statin therapy at any age.

Cardiology referral should be considered for patients with CAC
scores >=400 or >=75th percentile.

*3759 AHA/ACC/AACVPR/AAPA/ABC/CHAI/DAMJAN/RAZU AHMED/Noel/CINDY VIVIANA/LATINHO/JAQUELINE
Guideline on the Management of Blood Cholesterol: A Report of the
American College of Cardiology/American Heart Association Task Force
on Clinical Practice Guidelines. J Am Coll Cardiol.
7953;73(24):8667-8076.

Gaudi Agnes, DO [REDACTED]

*** End of Addendum ***
EXAM:
OVER-READ INTERPRETATION  CT CHEST

The following report is an over-read performed by radiologist Dr.
Kee Montecinos [REDACTED] on 01/30/2022. This
over-read does not include interpretation of cardiac or coronary
anatomy or pathology. The coronary calcium score interpretation by
the cardiologist is attached.
FINDINGS: Atherosclerotic calcifications in the thoracic aorta. Within the
visualized portions of the thorax there are no suspicious appearing
pulmonary nodules or masses, there is no acute consolidative
airspace disease, no pleural effusions, no pneumothorax and no
lymphadenopathy. Visualized portions of the upper abdomen are
unremarkable. There are no aggressive appearing lytic or blastic
lesions noted in the visualized portions of the skeleton.
IMPRESSION: 1.  Aortic Atherosclerosis (VQDF0-XUC.C).

## 2023-07-01 DIAGNOSIS — H524 Presbyopia: Secondary | ICD-10-CM | POA: Diagnosis not present

## 2023-07-01 DIAGNOSIS — C6931 Malignant neoplasm of right choroid: Secondary | ICD-10-CM | POA: Diagnosis not present

## 2023-07-01 DIAGNOSIS — H3321 Serous retinal detachment, right eye: Secondary | ICD-10-CM | POA: Diagnosis not present

## 2023-07-01 DIAGNOSIS — H43812 Vitreous degeneration, left eye: Secondary | ICD-10-CM | POA: Diagnosis not present

## 2023-07-01 DIAGNOSIS — H26492 Other secondary cataract, left eye: Secondary | ICD-10-CM | POA: Diagnosis not present

## 2023-07-01 DIAGNOSIS — H04123 Dry eye syndrome of bilateral lacrimal glands: Secondary | ICD-10-CM | POA: Diagnosis not present

## 2023-07-01 DIAGNOSIS — H319 Unspecified disorder of choroid: Secondary | ICD-10-CM | POA: Diagnosis not present

## 2023-07-01 DIAGNOSIS — H18413 Arcus senilis, bilateral: Secondary | ICD-10-CM | POA: Diagnosis not present

## 2023-07-20 DIAGNOSIS — C6931 Malignant neoplasm of right choroid: Secondary | ICD-10-CM | POA: Diagnosis not present

## 2023-07-20 DIAGNOSIS — H43812 Vitreous degeneration, left eye: Secondary | ICD-10-CM | POA: Diagnosis not present

## 2023-07-23 DIAGNOSIS — C6931 Malignant neoplasm of right choroid: Secondary | ICD-10-CM | POA: Diagnosis not present

## 2023-07-27 ENCOUNTER — Ambulatory Visit: Payer: Medicare Other | Admitting: Gastroenterology

## 2023-07-27 DIAGNOSIS — I2510 Atherosclerotic heart disease of native coronary artery without angina pectoris: Secondary | ICD-10-CM | POA: Diagnosis not present

## 2023-07-27 DIAGNOSIS — R609 Edema, unspecified: Secondary | ICD-10-CM | POA: Diagnosis not present

## 2023-07-27 DIAGNOSIS — I10 Essential (primary) hypertension: Secondary | ICD-10-CM | POA: Diagnosis not present

## 2023-07-27 DIAGNOSIS — R7303 Prediabetes: Secondary | ICD-10-CM | POA: Diagnosis not present

## 2023-07-27 DIAGNOSIS — C6931 Malignant neoplasm of right choroid: Secondary | ICD-10-CM | POA: Diagnosis not present

## 2023-07-27 DIAGNOSIS — Z23 Encounter for immunization: Secondary | ICD-10-CM | POA: Diagnosis not present

## 2023-07-27 DIAGNOSIS — N138 Other obstructive and reflux uropathy: Secondary | ICD-10-CM | POA: Diagnosis not present

## 2023-07-27 DIAGNOSIS — N401 Enlarged prostate with lower urinary tract symptoms: Secondary | ICD-10-CM | POA: Diagnosis not present

## 2023-07-27 DIAGNOSIS — E6609 Other obesity due to excess calories: Secondary | ICD-10-CM | POA: Diagnosis not present

## 2023-07-27 DIAGNOSIS — J321 Chronic frontal sinusitis: Secondary | ICD-10-CM | POA: Diagnosis not present

## 2023-07-27 DIAGNOSIS — K219 Gastro-esophageal reflux disease without esophagitis: Secondary | ICD-10-CM | POA: Diagnosis not present

## 2023-07-27 DIAGNOSIS — E782 Mixed hyperlipidemia: Secondary | ICD-10-CM | POA: Diagnosis not present

## 2023-07-27 DIAGNOSIS — Z8711 Personal history of peptic ulcer disease: Secondary | ICD-10-CM | POA: Diagnosis not present

## 2023-08-03 DIAGNOSIS — Z85840 Personal history of malignant neoplasm of eye: Secondary | ICD-10-CM | POA: Diagnosis not present

## 2023-08-03 DIAGNOSIS — K5730 Diverticulosis of large intestine without perforation or abscess without bleeding: Secondary | ICD-10-CM | POA: Diagnosis not present

## 2023-08-03 DIAGNOSIS — C6931 Malignant neoplasm of right choroid: Secondary | ICD-10-CM | POA: Diagnosis not present

## 2023-08-03 DIAGNOSIS — E049 Nontoxic goiter, unspecified: Secondary | ICD-10-CM | POA: Diagnosis not present

## 2023-08-03 DIAGNOSIS — E041 Nontoxic single thyroid nodule: Secondary | ICD-10-CM | POA: Diagnosis not present

## 2023-08-03 DIAGNOSIS — K449 Diaphragmatic hernia without obstruction or gangrene: Secondary | ICD-10-CM | POA: Diagnosis not present

## 2023-08-03 DIAGNOSIS — N200 Calculus of kidney: Secondary | ICD-10-CM | POA: Diagnosis not present

## 2023-08-03 DIAGNOSIS — I700 Atherosclerosis of aorta: Secondary | ICD-10-CM | POA: Diagnosis not present

## 2023-08-09 DIAGNOSIS — Z9841 Cataract extraction status, right eye: Secondary | ICD-10-CM | POA: Diagnosis not present

## 2023-08-09 DIAGNOSIS — C6931 Malignant neoplasm of right choroid: Secondary | ICD-10-CM | POA: Diagnosis not present

## 2023-08-09 DIAGNOSIS — I10 Essential (primary) hypertension: Secondary | ICD-10-CM | POA: Diagnosis not present

## 2023-08-09 DIAGNOSIS — Z9842 Cataract extraction status, left eye: Secondary | ICD-10-CM | POA: Diagnosis not present

## 2023-08-09 DIAGNOSIS — I2510 Atherosclerotic heart disease of native coronary artery without angina pectoris: Secondary | ICD-10-CM | POA: Diagnosis not present

## 2023-08-09 DIAGNOSIS — K219 Gastro-esophageal reflux disease without esophagitis: Secondary | ICD-10-CM | POA: Diagnosis not present

## 2023-08-09 DIAGNOSIS — Z961 Presence of intraocular lens: Secondary | ICD-10-CM | POA: Diagnosis not present

## 2023-08-09 DIAGNOSIS — Z6831 Body mass index (BMI) 31.0-31.9, adult: Secondary | ICD-10-CM | POA: Diagnosis not present

## 2023-08-09 DIAGNOSIS — E66811 Obesity, class 1: Secondary | ICD-10-CM | POA: Diagnosis not present

## 2023-08-09 DIAGNOSIS — Z87891 Personal history of nicotine dependence: Secondary | ICD-10-CM | POA: Diagnosis not present

## 2023-08-19 DIAGNOSIS — S92912D Unspecified fracture of left toe(s), subsequent encounter for fracture with routine healing: Secondary | ICD-10-CM | POA: Diagnosis not present

## 2023-08-19 DIAGNOSIS — M79675 Pain in left toe(s): Secondary | ICD-10-CM | POA: Diagnosis not present

## 2023-08-24 DIAGNOSIS — C6931 Malignant neoplasm of right choroid: Secondary | ICD-10-CM | POA: Diagnosis not present

## 2023-08-24 DIAGNOSIS — H43812 Vitreous degeneration, left eye: Secondary | ICD-10-CM | POA: Diagnosis not present

## 2023-08-31 DIAGNOSIS — C6931 Malignant neoplasm of right choroid: Secondary | ICD-10-CM | POA: Diagnosis not present

## 2023-09-01 DIAGNOSIS — E042 Nontoxic multinodular goiter: Secondary | ICD-10-CM | POA: Diagnosis not present

## 2023-09-01 DIAGNOSIS — E041 Nontoxic single thyroid nodule: Secondary | ICD-10-CM | POA: Diagnosis not present

## 2023-10-05 DIAGNOSIS — H43812 Vitreous degeneration, left eye: Secondary | ICD-10-CM | POA: Diagnosis not present

## 2023-10-05 DIAGNOSIS — C6931 Malignant neoplasm of right choroid: Secondary | ICD-10-CM | POA: Diagnosis not present

## 2023-10-26 DIAGNOSIS — Z23 Encounter for immunization: Secondary | ICD-10-CM | POA: Diagnosis not present

## 2023-10-26 DIAGNOSIS — R7303 Prediabetes: Secondary | ICD-10-CM | POA: Diagnosis not present

## 2023-10-26 DIAGNOSIS — I10 Essential (primary) hypertension: Secondary | ICD-10-CM | POA: Diagnosis not present

## 2023-10-26 DIAGNOSIS — Z0000 Encounter for general adult medical examination without abnormal findings: Secondary | ICD-10-CM | POA: Diagnosis not present

## 2023-10-26 DIAGNOSIS — I2510 Atherosclerotic heart disease of native coronary artery without angina pectoris: Secondary | ICD-10-CM | POA: Diagnosis not present

## 2023-10-26 DIAGNOSIS — E041 Nontoxic single thyroid nodule: Secondary | ICD-10-CM | POA: Diagnosis not present

## 2023-10-26 DIAGNOSIS — C6931 Malignant neoplasm of right choroid: Secondary | ICD-10-CM | POA: Diagnosis not present

## 2023-11-09 DIAGNOSIS — R0981 Nasal congestion: Secondary | ICD-10-CM | POA: Diagnosis not present

## 2023-11-09 DIAGNOSIS — J019 Acute sinusitis, unspecified: Secondary | ICD-10-CM | POA: Diagnosis not present

## 2023-11-09 DIAGNOSIS — R051 Acute cough: Secondary | ICD-10-CM | POA: Diagnosis not present

## 2023-11-09 DIAGNOSIS — M791 Myalgia, unspecified site: Secondary | ICD-10-CM | POA: Diagnosis not present

## 2023-11-15 DIAGNOSIS — L821 Other seborrheic keratosis: Secondary | ICD-10-CM | POA: Diagnosis not present

## 2023-11-15 DIAGNOSIS — D1801 Hemangioma of skin and subcutaneous tissue: Secondary | ICD-10-CM | POA: Diagnosis not present

## 2023-11-15 DIAGNOSIS — L820 Inflamed seborrheic keratosis: Secondary | ICD-10-CM | POA: Diagnosis not present

## 2023-11-15 DIAGNOSIS — Z1283 Encounter for screening for malignant neoplasm of skin: Secondary | ICD-10-CM | POA: Diagnosis not present

## 2023-11-15 DIAGNOSIS — L719 Rosacea, unspecified: Secondary | ICD-10-CM | POA: Diagnosis not present

## 2023-12-01 ENCOUNTER — Other Ambulatory Visit: Payer: Self-pay | Admitting: Cardiovascular Disease

## 2023-12-06 ENCOUNTER — Telehealth: Payer: Self-pay | Admitting: Gastroenterology

## 2023-12-06 MED ORDER — PANTOPRAZOLE SODIUM 40 MG PO TBEC: 40.0000 mg | DELAYED_RELEASE_TABLET | Freq: Every day | ORAL | 1 refills | Status: AC

## 2023-12-06 NOTE — Telephone Encounter (Signed)
PT is calling to have a refill for pantoprazole sent to [facility]. He is asking for a 180 quantity. Please advise.

## 2023-12-06 NOTE — Telephone Encounter (Signed)
Refill sent for pantoprazole.

## 2023-12-07 DIAGNOSIS — C6931 Malignant neoplasm of right choroid: Secondary | ICD-10-CM | POA: Diagnosis not present

## 2023-12-07 DIAGNOSIS — H43812 Vitreous degeneration, left eye: Secondary | ICD-10-CM | POA: Diagnosis not present

## 2023-12-14 DIAGNOSIS — C6990 Malignant neoplasm of unspecified site of unspecified eye: Secondary | ICD-10-CM | POA: Diagnosis not present

## 2023-12-20 ENCOUNTER — Other Ambulatory Visit: Payer: Self-pay | Admitting: Urology

## 2023-12-20 DIAGNOSIS — N200 Calculus of kidney: Secondary | ICD-10-CM | POA: Diagnosis not present

## 2023-12-20 DIAGNOSIS — E291 Testicular hypofunction: Secondary | ICD-10-CM | POA: Diagnosis not present

## 2023-12-20 DIAGNOSIS — R351 Nocturia: Secondary | ICD-10-CM | POA: Diagnosis not present

## 2023-12-20 DIAGNOSIS — N401 Enlarged prostate with lower urinary tract symptoms: Secondary | ICD-10-CM | POA: Diagnosis not present

## 2023-12-20 DIAGNOSIS — R948 Abnormal results of function studies of other organs and systems: Secondary | ICD-10-CM | POA: Diagnosis not present

## 2023-12-22 ENCOUNTER — Ambulatory Visit: Payer: Medicare Other

## 2023-12-23 ENCOUNTER — Other Ambulatory Visit (HOSPITAL_COMMUNITY): Payer: Medicare Other

## 2023-12-23 NOTE — Progress Notes (Signed)
Talked with patient. Instructions given. Arrival time 0600. Instructed on directions to [employer]. Hx and meds reviewed. Can take am meds with a sip of water on Friday morning. Bring in blue folder and driver is secured. Npo after midnight

## 2023-12-31 ENCOUNTER — Other Ambulatory Visit: Payer: Self-pay

## 2023-12-31 ENCOUNTER — Ambulatory Visit (HOSPITAL_COMMUNITY): Payer: Medicare Other

## 2023-12-31 ENCOUNTER — Encounter (HOSPITAL_COMMUNITY): Payer: Self-pay | Admitting: Urology

## 2023-12-31 ENCOUNTER — Ambulatory Visit (HOSPITAL_COMMUNITY)
Admission: RE | Admit: 2023-12-31 | Discharge: 2023-12-31 | Disposition: A | Discharge status: Home / Self Care | Payer: Medicare Other | Attending: Urology | Admitting: Urology

## 2023-12-31 ENCOUNTER — Encounter (HOSPITAL_COMMUNITY)
Admission: RE | Disposition: A | Discharge status: Home / Self Care | Payer: Self-pay | Source: Home / Self Care | Attending: Urology

## 2023-12-31 DIAGNOSIS — I878 Other specified disorders of veins: Secondary | ICD-10-CM | POA: Diagnosis not present

## 2023-12-31 DIAGNOSIS — Z9049 Acquired absence of other specified parts of digestive tract: Secondary | ICD-10-CM | POA: Diagnosis not present

## 2023-12-31 DIAGNOSIS — I10 Essential (primary) hypertension: Secondary | ICD-10-CM | POA: Insufficient documentation

## 2023-12-31 DIAGNOSIS — Z6830 Body mass index (BMI) 30.0-30.9, adult: Secondary | ICD-10-CM | POA: Diagnosis not present

## 2023-12-31 DIAGNOSIS — N200 Calculus of kidney: Secondary | ICD-10-CM | POA: Diagnosis not present

## 2023-12-31 DIAGNOSIS — E669 Obesity, unspecified: Secondary | ICD-10-CM | POA: Insufficient documentation

## 2023-12-31 HISTORY — PX: EXTRACORPOREAL SHOCK WAVE LITHOTRIPSY: SHX1557

## 2023-12-31 SURGERY — LITHOTRIPSY, ESWL
Anesthesia: LOCAL | Laterality: Left

## 2023-12-31 MED ORDER — SODIUM CHLORIDE 0.9 % IV SOLN
INTRAVENOUS | Status: DC
Start: 2023-12-31 — End: 2024-01-01
  Administered 2023-12-31: 1000 mL via INTRAVENOUS

## 2023-12-31 MED ORDER — CIPROFLOXACIN HCL 500 MG PO TABS
500.0000 mg | ORAL_TABLET | ORAL | Status: AC
  Administered 2023-12-31: 500 mg via ORAL
  Filled 2023-12-31: qty 1

## 2023-12-31 MED ORDER — DIPHENHYDRAMINE HCL 25 MG PO CAPS
25.0000 mg | ORAL_CAPSULE | ORAL | Status: AC
  Administered 2023-12-31: 25 mg via ORAL
  Filled 2023-12-31: qty 1

## 2023-12-31 MED ORDER — OXYCODONE-ACETAMINOPHEN 5-325 MG PO TABS: 1.0000 | ORAL_TABLET | ORAL | 0 refills | Status: AC | PRN

## 2023-12-31 MED ORDER — DIAZEPAM 5 MG PO TABS
10.0000 mg | ORAL_TABLET | ORAL | Status: AC
  Administered 2023-12-31: 10 mg via ORAL
  Filled 2023-12-31: qty 2

## 2023-12-31 MED ORDER — TAMSULOSIN HCL 0.4 MG PO CAPS: 0.4000 mg | ORAL_CAPSULE | Freq: Every day | ORAL | 0 refills | Status: DC

## 2023-12-31 NOTE — Discharge Instructions (Signed)
   Activity:  You are encouraged to ambulate frequently (about every hour during waking hours) to help prevent blood clots from forming in your legs or lungs.  However, you should not engage in any heavy lifting (> 10-15 lbs), strenuous activity, or straining.   Diet: You should advance your diet as instructed by your physician.  It will be normal to have some bloating, nausea, and abdominal discomfort intermittently.   Prescriptions:  You will be provided a prescription for pain medication to take as needed.  If your pain is not severe enough to require the prescription pain medication, you may take extra strength Tylenol instead which will have less side effects.  You should also take a prescribed stool softener to avoid straining with bowel movements as the prescription pain medication may constipate you.   Incisions: You may remove your dressing bandages 48 hours after surgery if not removed in the hospital.  You will either have some small staples or special tissue glue at each of the incision sites. Once the bandages are removed (if present), the incisions may stay open to air.  You may start showering (but not soaking or bathing in water) the 2nd day after surgery and the incisions simply need to be patted dry after the shower.  No additional care is needed.   What to call us about: You should call the office [phone]) if you develop fever > 101 or develop persistent vomiting. Activity:  You are encouraged to ambulate frequently (about every hour during waking hours) to help prevent blood clots from forming in your legs or lungs.  However, you should not engage in any heavy lifting (> 10-15 lbs), strenuous activity, or straining.

## 2023-12-31 NOTE — Op Note (Signed)
[facility] Operative Note ? ?Treating Physician: Alverta Caccamo, MD ? ?Pre-op diagnosis: Left renal stone ? ?Post-op diagnosis: Same  ? ?Procedure: Left ESWL ? ?See [facility] OP note scanned into chart. Also because of the size, density, location and other factors that cannot be anticipated I feel this will likely be a staged procedure. This fact supersedes any indication in the scanned [facility] operative note to the contrary. ? ?Matt R. Zahlia Deshazer MD ?[facility]  ?Pager: [phone] ?  ?

## 2023-12-31 NOTE — H&P (Signed)
Urology Preoperative H&P   Chief Complaint: Left renal stone  History of Present Illness: Barry Mcdonald is a 73 y.o. male with a left renal stone here for left ESWL. Denies fevers, chills, dysuria.     Past Medical History:  Diagnosis Date   Arthritis    Bilateral nephrolithiasis    Chest pain    Diverticulosis    Fatty liver 06/16/2022   abdominal ultrasound   GERD (gastroesophageal reflux disease)    Hx of adenomatous polyp of colon 11/2017   and '09   Hypertension    Prostatism    Sensorineural hearing loss (SNHL) of both ears     Past Surgical History:  Procedure Laterality Date   CHOLECYSTECTOMY  2021   COLONOSCOPY WITH ESOPHAGOGASTRODUODENOSCOPY (EGD)  05/06/2023   Ileene Patrick at [facility], mult polyps   EXTRACORPOREAL SHOCK WAVE LITHOTRIPSY Left 07/20/2022   Procedure: EXTRACORPOREAL SHOCK WAVE LITHOTRIPSY ([facility]);  Surgeon: Jerilee Field, MD;  Location: [facility];  Service: Urology;  Laterality: Left;   EYE SURGERY Bilateral    Cataract   NASAL SEPTOPLASTY W/ TURBINOPLASTY  01/2016    Allergies: No Known Allergies  Family History  Problem Relation Age of Onset   Arthritis Mother    Hearing loss Mother    Coronary artery disease Father    Stroke Father    Hearing loss Father    Breast cancer Sister    Diabetes Brother    Heart attack Paternal Grandfather        21   Colon cancer Neg Hx    Stomach cancer Neg Hx    Esophageal cancer Neg Hx     Social History:  reports that he quit smoking about 40 years ago. His smoking use included cigarettes. He started smoking about 60 years ago. He has a 20 pack-year smoking history. He has never used smokeless tobacco. He reports current alcohol use. He reports that he does not use drugs.  ROS: A complete review of systems was performed.  All systems are negative except for pertinent findings as noted.  Physical Exam:  Vital signs in last 24 hours: Temp:  [98 F (36.7 C)] 98 F (36.7 C)  (02/21 0639) Pulse Rate:  [64] (P) 64 (02/21 0700) Resp:  [16] 16 (02/21 0639) BP: (147)/(83) 147/83 (02/21 0639) SpO2:  [98 %] (P) 98 % (02/21 0700) Weight:  [91.2 kg] 91.2 kg (02/21 0639) Constitutional:  Alert and oriented, No acute distress Cardiovascular: Regular rate and rhythm Respiratory: Normal respiratory effort, Lungs clear bilaterally GI: Abdomen is soft, nontender, nondistended, no abdominal masses GU: No CVA tenderness Lymphatic: No lymphadenopathy Neurologic: Grossly intact, no focal deficits Psychiatric: Normal mood and affect  Laboratory Data:  No results for input(s): "WBC", "HGB", "HCT", "PLT" in the last 72 hours.  No results for input(s): "NA", "K", "CL", "GLUCOSE", "BUN", "CALCIUM", "CREATININE" in the last 72 hours.  Invalid input(s): "CO3"   No results found for this or any previous visit (from the past 24 hours). No results found for this or any previous visit (from the past 240 hours).  Renal Function: No results for input(s): "CREATININE" in the last 168 hours. CrCl cannot be calculated (Patient's most recent lab result is older than the maximum 21 days allowed.).  Radiologic Imaging: No results found.  I independently reviewed the above imaging studies.  Assessment and Plan Barry Mcdonald is a 73 y.o. male with a left renal stone here for left ESWL.  The risks, benefits and  alternatives of left ESWL was discussed with the patient. I described the risks which include arrhythmia, kidney contusion, kidney hemorrhage, need for transfusion, back discomfort, flank ecchymosis, flank abrasion, inability to fracture the stone, inability to pass stone fragments, Steinstrasse, infection associated with obstructing stones, need for an alternative surgical procedure and possible need for repeat shockwave lithotripsy.  The patient voices understanding and wishes to proceed.       Matt R. Arelyn Gauer MD 12/31/2023, 7:46 AM  [facility] Pager:  [phone]

## 2024-01-03 DIAGNOSIS — M4316 Spondylolisthesis, lumbar region: Secondary | ICD-10-CM | POA: Diagnosis not present

## 2024-01-03 DIAGNOSIS — C6960 Malignant neoplasm of unspecified orbit: Secondary | ICD-10-CM | POA: Diagnosis not present

## 2024-01-03 DIAGNOSIS — C6990 Malignant neoplasm of unspecified site of unspecified eye: Secondary | ICD-10-CM | POA: Diagnosis not present

## 2024-01-03 DIAGNOSIS — E041 Nontoxic single thyroid nodule: Secondary | ICD-10-CM | POA: Diagnosis not present

## 2024-01-03 DIAGNOSIS — N281 Cyst of kidney, acquired: Secondary | ICD-10-CM | POA: Diagnosis not present

## 2024-01-03 DIAGNOSIS — C6930 Malignant neoplasm of unspecified choroid: Secondary | ICD-10-CM | POA: Diagnosis not present

## 2024-01-03 DIAGNOSIS — N200 Calculus of kidney: Secondary | ICD-10-CM | POA: Diagnosis not present

## 2024-01-03 DIAGNOSIS — I700 Atherosclerosis of aorta: Secondary | ICD-10-CM | POA: Diagnosis not present

## 2024-01-03 DIAGNOSIS — M47816 Spondylosis without myelopathy or radiculopathy, lumbar region: Secondary | ICD-10-CM | POA: Diagnosis not present

## 2024-01-03 DIAGNOSIS — E049 Nontoxic goiter, unspecified: Secondary | ICD-10-CM | POA: Diagnosis not present

## 2024-01-03 DIAGNOSIS — I2510 Atherosclerotic heart disease of native coronary artery without angina pectoris: Secondary | ICD-10-CM | POA: Diagnosis not present

## 2024-01-03 DIAGNOSIS — K5730 Diverticulosis of large intestine without perforation or abscess without bleeding: Secondary | ICD-10-CM | POA: Diagnosis not present

## 2024-01-03 DIAGNOSIS — K449 Diaphragmatic hernia without obstruction or gangrene: Secondary | ICD-10-CM | POA: Diagnosis not present

## 2024-01-07 ENCOUNTER — Encounter (HOSPITAL_COMMUNITY): Payer: Self-pay | Admitting: Urology

## 2024-01-11 DIAGNOSIS — C6931 Malignant neoplasm of right choroid: Secondary | ICD-10-CM | POA: Diagnosis not present

## 2024-01-11 DIAGNOSIS — C6990 Malignant neoplasm of unspecified site of unspecified eye: Secondary | ICD-10-CM | POA: Diagnosis not present

## 2024-01-20 ENCOUNTER — Other Ambulatory Visit: Payer: Self-pay | Admitting: Cardiovascular Disease

## 2024-01-21 DIAGNOSIS — N200 Calculus of kidney: Secondary | ICD-10-CM | POA: Diagnosis not present

## 2024-02-29 ENCOUNTER — Other Ambulatory Visit: Payer: Self-pay | Admitting: Cardiovascular Disease

## 2024-03-09 DIAGNOSIS — R051 Acute cough: Secondary | ICD-10-CM | POA: Diagnosis not present

## 2024-03-09 DIAGNOSIS — J019 Acute sinusitis, unspecified: Secondary | ICD-10-CM | POA: Diagnosis not present

## 2024-03-09 DIAGNOSIS — R0981 Nasal congestion: Secondary | ICD-10-CM | POA: Diagnosis not present

## 2024-03-09 DIAGNOSIS — R0602 Shortness of breath: Secondary | ICD-10-CM | POA: Diagnosis not present

## 2024-03-18 DIAGNOSIS — R051 Acute cough: Secondary | ICD-10-CM | POA: Diagnosis not present

## 2024-03-18 DIAGNOSIS — R0981 Nasal congestion: Secondary | ICD-10-CM | POA: Diagnosis not present

## 2024-04-18 DIAGNOSIS — K219 Gastro-esophageal reflux disease without esophagitis: Secondary | ICD-10-CM | POA: Diagnosis not present

## 2024-04-18 DIAGNOSIS — Z6841 Body Mass Index (BMI) 40.0 and over, adult: Secondary | ICD-10-CM | POA: Diagnosis not present

## 2024-04-18 DIAGNOSIS — C6931 Malignant neoplasm of right choroid: Secondary | ICD-10-CM | POA: Diagnosis not present

## 2024-04-18 DIAGNOSIS — I10 Essential (primary) hypertension: Secondary | ICD-10-CM | POA: Diagnosis not present

## 2024-04-18 DIAGNOSIS — E785 Hyperlipidemia, unspecified: Secondary | ICD-10-CM | POA: Diagnosis not present

## 2024-04-18 DIAGNOSIS — N400 Enlarged prostate without lower urinary tract symptoms: Secondary | ICD-10-CM | POA: Diagnosis not present

## 2024-04-18 DIAGNOSIS — H43812 Vitreous degeneration, left eye: Secondary | ICD-10-CM | POA: Diagnosis not present

## 2024-04-18 DIAGNOSIS — Z961 Presence of intraocular lens: Secondary | ICD-10-CM | POA: Diagnosis not present

## 2024-04-25 DIAGNOSIS — I700 Atherosclerosis of aorta: Secondary | ICD-10-CM | POA: Diagnosis not present

## 2024-04-25 DIAGNOSIS — E782 Mixed hyperlipidemia: Secondary | ICD-10-CM | POA: Diagnosis not present

## 2024-04-25 DIAGNOSIS — E6609 Other obesity due to excess calories: Secondary | ICD-10-CM | POA: Diagnosis not present

## 2024-04-25 DIAGNOSIS — Z6830 Body mass index (BMI) 30.0-30.9, adult: Secondary | ICD-10-CM | POA: Diagnosis not present

## 2024-04-25 DIAGNOSIS — K219 Gastro-esophageal reflux disease without esophagitis: Secondary | ICD-10-CM | POA: Diagnosis not present

## 2024-04-25 DIAGNOSIS — I10 Essential (primary) hypertension: Secondary | ICD-10-CM | POA: Diagnosis not present

## 2024-04-25 DIAGNOSIS — I2510 Atherosclerotic heart disease of native coronary artery without angina pectoris: Secondary | ICD-10-CM | POA: Diagnosis not present

## 2024-04-25 DIAGNOSIS — R7303 Prediabetes: Secondary | ICD-10-CM | POA: Diagnosis not present

## 2024-04-25 DIAGNOSIS — E66811 Obesity, class 1: Secondary | ICD-10-CM | POA: Diagnosis not present

## 2024-05-09 DIAGNOSIS — C6931 Malignant neoplasm of right choroid: Secondary | ICD-10-CM | POA: Diagnosis not present

## 2024-05-15 DIAGNOSIS — K219 Gastro-esophageal reflux disease without esophagitis: Secondary | ICD-10-CM | POA: Diagnosis not present

## 2024-05-15 DIAGNOSIS — N400 Enlarged prostate without lower urinary tract symptoms: Secondary | ICD-10-CM | POA: Diagnosis not present

## 2024-05-15 DIAGNOSIS — Z6832 Body mass index (BMI) 32.0-32.9, adult: Secondary | ICD-10-CM | POA: Diagnosis not present

## 2024-05-15 DIAGNOSIS — E785 Hyperlipidemia, unspecified: Secondary | ICD-10-CM | POA: Diagnosis not present

## 2024-05-15 DIAGNOSIS — I10 Essential (primary) hypertension: Secondary | ICD-10-CM | POA: Diagnosis not present

## 2024-05-15 DIAGNOSIS — E66811 Obesity, class 1: Secondary | ICD-10-CM | POA: Diagnosis not present

## 2024-05-23 DIAGNOSIS — C6990 Malignant neoplasm of unspecified site of unspecified eye: Secondary | ICD-10-CM | POA: Diagnosis not present

## 2024-05-23 DIAGNOSIS — C6931 Malignant neoplasm of right choroid: Secondary | ICD-10-CM | POA: Diagnosis not present

## 2024-06-05 ENCOUNTER — Telehealth: Payer: Self-pay | Admitting: Cardiovascular Disease

## 2024-06-05 ENCOUNTER — Encounter: Payer: Self-pay | Admitting: Cardiovascular Disease

## 2024-06-05 MED ORDER — AMLODIPINE BESYLATE 5 MG PO TABS: 5.0000 mg | ORAL_TABLET | Freq: Every day | ORAL | 1 refills | Status: DC

## 2024-06-05 NOTE — Telephone Encounter (Signed)
 Spoke with pt. Pt is out of medication as of last night. Dr Marjean nurse set him up with a appointment and gave the ok to fill the medication until appointment.

## 2024-06-05 NOTE — Telephone Encounter (Signed)
 Taken care of in Patient Calls. See call from 7/28

## 2024-06-05 NOTE — Telephone Encounter (Signed)
 Pt c/o medication issue:  1. Name of Medication: Amlodipine  5 mg  2. How are you currently taking this medication (dosage and times per day)? 1 tablet daily  3. Are you having a reaction (difficulty breathing--STAT)? No   4. What is your medication issue? Pt called about a refill but this medication has been discontinued. Pt states he has been taking it. Please advise

## 2024-06-19 DIAGNOSIS — L570 Actinic keratosis: Secondary | ICD-10-CM | POA: Diagnosis not present

## 2024-06-19 DIAGNOSIS — L814 Other melanin hyperpigmentation: Secondary | ICD-10-CM | POA: Diagnosis not present

## 2024-06-19 DIAGNOSIS — L720 Epidermal cyst: Secondary | ICD-10-CM | POA: Diagnosis not present

## 2024-06-19 DIAGNOSIS — L821 Other seborrheic keratosis: Secondary | ICD-10-CM | POA: Diagnosis not present

## 2024-06-19 DIAGNOSIS — D1801 Hemangioma of skin and subcutaneous tissue: Secondary | ICD-10-CM | POA: Diagnosis not present

## 2024-07-12 ENCOUNTER — Other Ambulatory Visit: Payer: Self-pay | Admitting: Cardiovascular Disease

## 2024-07-21 DIAGNOSIS — M19072 Primary osteoarthritis, left ankle and foot: Secondary | ICD-10-CM | POA: Diagnosis not present

## 2024-07-21 DIAGNOSIS — M7752 Other enthesopathy of left foot: Secondary | ICD-10-CM | POA: Diagnosis not present

## 2024-07-24 DIAGNOSIS — N200 Calculus of kidney: Secondary | ICD-10-CM | POA: Diagnosis not present

## 2024-07-24 DIAGNOSIS — R351 Nocturia: Secondary | ICD-10-CM | POA: Diagnosis not present

## 2024-07-24 DIAGNOSIS — N401 Enlarged prostate with lower urinary tract symptoms: Secondary | ICD-10-CM | POA: Diagnosis not present

## 2024-07-25 ENCOUNTER — Other Ambulatory Visit: Payer: Self-pay | Admitting: Cardiovascular Disease

## 2024-07-25 ENCOUNTER — Other Ambulatory Visit: Payer: Self-pay

## 2024-08-11 DIAGNOSIS — R051 Acute cough: Secondary | ICD-10-CM | POA: Diagnosis not present

## 2024-08-11 DIAGNOSIS — R0981 Nasal congestion: Secondary | ICD-10-CM | POA: Diagnosis not present

## 2024-08-11 DIAGNOSIS — J019 Acute sinusitis, unspecified: Secondary | ICD-10-CM | POA: Diagnosis not present

## 2024-08-14 DIAGNOSIS — M79661 Pain in right lower leg: Secondary | ICD-10-CM | POA: Diagnosis not present

## 2024-08-14 DIAGNOSIS — M79605 Pain in left leg: Secondary | ICD-10-CM | POA: Diagnosis not present

## 2024-08-14 DIAGNOSIS — M79662 Pain in left lower leg: Secondary | ICD-10-CM | POA: Diagnosis not present

## 2024-08-14 DIAGNOSIS — M79604 Pain in right leg: Secondary | ICD-10-CM | POA: Diagnosis not present

## 2024-08-14 DIAGNOSIS — I83893 Varicose veins of bilateral lower extremities with other complications: Secondary | ICD-10-CM | POA: Diagnosis not present

## 2024-08-14 NOTE — Progress Notes (Signed)
 Patient ID: Barry Mcdonald, male   DOB: Oct 31, 1951, 73 y.o.   MRN: [id number]     73 y.o. f/u HTN and atypical chest pain. Cath 2013 normal cors. On diuretic,norvasc  and ARB for HTN   More sedentary during COVID Barry Mcdonald girlfriend Rx breast cancer lumpectomy/XRT 2020 He had his GB out 2020  Enjoys going to beach and [place] a lot   Had right/left Hallux medial nail avulsion 05/28/21   No cardiac issues Has some small dogs at home but doesn't walk them   Calcium  score done 01/30/22 was 313 , 66 th percentile for age/sex  Majority of it in LAD On statin and ASA  LDL 59 on 04/25/24 F/U ETT done 06/22/23 was normal    GERD/Reflux. EGD 06/2022 with ulcer in GEJ while using NSAI's Also history of colon polyps 2019 F/U EGD 05/06/23 no Barrett's and gastric/duodenal polyp removed Colonoscopy with more polyps removed and some diverticulosis   He is on his 3rd wife. Very impatient person. Has some varicosities on LLE at ankle Discussed his visits with vein clinic   ROS: Denies fever, malais, weight loss, blurry vision, decreased visual acuity, cough, sputum, SOB, hemoptysis, pleuritic pain, palpitaitons, heartburn, abdominal pain, melena, lower extremity edema, claudication, or rash.  All other systems reviewed and negative  General: BP 132/82 (BP Location: Left Arm, Patient Position: Sitting, Cuff Size: Normal)   Pulse (!) 57   Ht 5' 8 (1.727 m)   Wt 206 lb (93.4 kg)   SpO2 97%   BMI 31.32 kg/m    Affect appropriate Overweight white male  HEENT: normal Neck supple with no adenopathy JVP normal no bruits no thyromegaly Lungs clear with no wheezing and good diaphragmatic motion Heart:  S1/S2 no murmur, no rub, gallop or click PMI normal Abdomen: benighn, BS positve, no tenderness, no AAA ventral hernia  Rectus diastasis no bruit.  No HSM or HJR Distal pulses intact with no bruits Plus one edema Neuro non-focal Skin warm and dry No muscular weakness    Current Outpatient  Medications  Medication Sig Dispense Refill   alfuzosin (UROXATRAL) 10 MG 24 hr tablet Take 10 mg by mouth daily.     amLODipine  (NORVASC ) 5 MG tablet Take 1 tablet (5 mg total) by mouth daily. 90 tablet 1   atorvastatin  (LIPITOR) 10 MG tablet TAKE 1 TABLET BY MOUTH EVERY DAY 90 tablet 1   furosemide  (LASIX ) 20 MG tablet TAKE 1 TABLET BY MOUTH EVERY DAY 30 tablet 0   losartan  (COZAAR ) 100 MG tablet TAKE 1 TABLET BY MOUTH EVERY DAY 90 tablet 2   pantoprazole  (PROTONIX ) 40 MG tablet Take 1 tablet (40 mg total) by mouth daily. 90 tablet 1   oxyCODONE -acetaminophen  (PERCOCET) 5-325 MG tablet Take 1 tablet by mouth every 4 (four) hours as needed for up to 18 doses for severe pain (pain score 7-10). (Patient not taking: Reported on 08/23/2024) 18 tablet 0   No current facility-administered medications for this visit.    Allergies  Patient has no known allergies.  Electrocardiogram 12/15/22 SR rate 71 LVH no acute changes   Assessment and Plan  HTN:  Continue diuretic and ARB Norvasc  started 08/2020   Edema: improved with diuretic -> dependant   GERD:  Continue proton pump inhibiter and low carb diet EGD June 2024 ok GI:  Rectus diastasis stable no need for surgery better with weight loss Post GB removal with no biliary cholic History of colon polyps last removed 05/06/23  HLD:  with  above average calcium  score Continue statin LdL at goal  CAD: sub clinical with high calcium  score for age ASA/statin ETT was normal on 06/22/23  Melanoma:  right eye f/u ophthalmology regularly Sees derm and has had cryoRx of benign keratoses and nevi    F/U in a year   Maude Emmer

## 2024-08-22 DIAGNOSIS — H43812 Vitreous degeneration, left eye: Secondary | ICD-10-CM | POA: Diagnosis not present

## 2024-08-22 DIAGNOSIS — Z961 Presence of intraocular lens: Secondary | ICD-10-CM | POA: Diagnosis not present

## 2024-08-22 DIAGNOSIS — C6931 Malignant neoplasm of right choroid: Secondary | ICD-10-CM | POA: Diagnosis not present

## 2024-08-23 ENCOUNTER — Encounter: Payer: Self-pay | Admitting: Cardiovascular Disease

## 2024-08-23 ENCOUNTER — Ambulatory Visit: Attending: Cardiovascular Disease | Admitting: Cardiovascular Disease

## 2024-08-23 ENCOUNTER — Other Ambulatory Visit: Payer: Self-pay | Admitting: Cardiovascular Disease

## 2024-08-23 VITALS — BP 132/82 | HR 57 | Ht 68.0 in | Wt 206.0 lb

## 2024-08-23 DIAGNOSIS — R600 Localized edema: Secondary | ICD-10-CM | POA: Diagnosis not present

## 2024-08-23 DIAGNOSIS — E782 Mixed hyperlipidemia: Secondary | ICD-10-CM | POA: Insufficient documentation

## 2024-08-23 DIAGNOSIS — R931 Abnormal findings on diagnostic imaging of heart and coronary circulation: Secondary | ICD-10-CM | POA: Insufficient documentation

## 2024-08-23 DIAGNOSIS — I10 Essential (primary) hypertension: Secondary | ICD-10-CM | POA: Insufficient documentation

## 2024-08-23 NOTE — Patient Instructions (Addendum)
 Medication Instructions:  Your physician recommends that you continue on your current medications as directed. Please refer to the Current Medication list given to you today.  *If you need a refill on your cardiac medications before your next appointment, please call your pharmacy*  Lab Work: NONE If you have labs (blood work) drawn today and your tests are completely normal, you will receive your results only by: MyChart Message (if you have MyChart) OR A paper copy in the mail If you have any lab test that is abnormal or we need to change your treatment, we will call you to review the results.  Testing/Procedures: NONE  Follow-Up: At [facility], you and your health needs are our priority.  As part of our continuing mission to provide you with exceptional heart care, our providers are all part of one team.  This team includes your primary Cardiologist (physician) and Advanced Practice Providers or APPs (Physician Assistants and Nurse Practitioners) who all work together to provide you with the care you need, when you need it.  Your next appointment:   1 YEAR  Provider:   Maude Emmer, MD    We recommend signing up for the patient portal called MyChart.  Sign up information is provided on this After Visit Summary.  MyChart is used to connect with patients for Virtual Visits (Telemedicine).  Patients are able to view lab/test results, encounter notes, upcoming appointments, etc.  Non-urgent messages can be sent to your provider as well.   To learn more about what you can do with MyChart, go to [email or web address].   Other Instructions  NONE

## 2024-10-10 DIAGNOSIS — R070 Pain in throat: Secondary | ICD-10-CM | POA: Diagnosis not present

## 2024-10-10 DIAGNOSIS — R059 Cough, unspecified: Secondary | ICD-10-CM | POA: Diagnosis not present

## 2024-10-10 DIAGNOSIS — R0981 Nasal congestion: Secondary | ICD-10-CM | POA: Diagnosis not present

## 2024-10-27 ENCOUNTER — Other Ambulatory Visit: Payer: Self-pay | Admitting: Cardiovascular Disease

## 2024-10-27 MED ORDER — LOSARTAN POTASSIUM 100 MG PO TABS: 100.0000 mg | ORAL_TABLET | Freq: Every day | ORAL | 2 refills | Status: AC

## 2024-11-22 ENCOUNTER — Encounter (HOSPITAL_COMMUNITY)

## 2024-11-22 ENCOUNTER — Other Ambulatory Visit: Payer: Self-pay | Admitting: Cardiovascular Disease

## 2024-11-22 ENCOUNTER — Encounter
